# Patient Record
Sex: Female | Born: 1980 | Race: Black or African American | Hispanic: No | Marital: Married | Smoking: Current every day smoker
Health system: Southern US, Community
[De-identification: ages and names within clinical notes are randomized; demographics above are authoritative.]

## PROBLEM LIST (undated history)

## (undated) ENCOUNTER — Inpatient Hospital Stay (HOSPITAL_COMMUNITY): Payer: Self-pay

## (undated) DIAGNOSIS — L989 Disorder of the skin and subcutaneous tissue, unspecified: Secondary | ICD-10-CM

## (undated) DIAGNOSIS — D649 Anemia, unspecified: Secondary | ICD-10-CM

## (undated) HISTORY — PX: DILATION AND CURETTAGE OF UTERUS: SHX78

## (undated) HISTORY — DX: Anemia, unspecified: D64.9

---

## 2011-12-26 HISTORY — PX: COLPOSCOPY: SHX161

## 2012-03-03 ENCOUNTER — Encounter (HOSPITAL_COMMUNITY): Payer: Self-pay | Admitting: *Deleted

## 2012-03-03 ENCOUNTER — Emergency Department (HOSPITAL_COMMUNITY)
Admission: EM | Admit: 2012-03-03 | Discharge: 2012-03-03 | Disposition: A | Discharge status: Home / Self Care | Payer: Medicaid - Out of State | Attending: Emergency Medicine | Admitting: Emergency Medicine

## 2012-03-03 DIAGNOSIS — L0291 Cutaneous abscess, unspecified: Secondary | ICD-10-CM | POA: Diagnosis present

## 2012-03-03 DIAGNOSIS — L0390 Cellulitis, unspecified: Secondary | ICD-10-CM | POA: Diagnosis present

## 2012-03-03 DIAGNOSIS — F17200 Nicotine dependence, unspecified, uncomplicated: Secondary | ICD-10-CM | POA: Insufficient documentation

## 2012-03-03 DIAGNOSIS — L03319 Cellulitis of trunk, unspecified: Secondary | ICD-10-CM | POA: Insufficient documentation

## 2012-03-03 DIAGNOSIS — L02219 Cutaneous abscess of trunk, unspecified: Secondary | ICD-10-CM | POA: Diagnosis not present

## 2012-03-03 DIAGNOSIS — L02211 Cutaneous abscess of abdominal wall: Secondary | ICD-10-CM

## 2012-03-03 MED ORDER — LIDOCAINE HCL 1 % IJ SOLN
INTRAMUSCULAR | Status: AC
  Administered 2012-03-03: 09:00:00 via SUBCUTANEOUS
  Filled 2012-03-03: qty 20

## 2012-03-03 MED ORDER — HYDROCODONE-ACETAMINOPHEN 5-325 MG PO TABS: 1.0000 | ORAL_TABLET | Freq: Four times a day (QID) | ORAL | Status: AC | PRN

## 2012-03-03 MED ORDER — OXYCODONE-ACETAMINOPHEN 5-325 MG PO TABS: 1.0000 | ORAL_TABLET | Freq: Once | ORAL | Status: DC

## 2012-03-03 NOTE — ED Notes (Signed)
Patient is alert and oriented x3.  She was given DC instructions and follow up visit instructions.  Patient gave verbal understanding. She was DC ambulatory under his own power to home.  V/S stable.  He was not showing any signs of distress on DC 

## 2012-03-03 NOTE — ED Notes (Signed)
Pt c/o boil in RLQ of abdomen. Pt denies hx of same. Pt denies drainage.

## 2012-03-03 NOTE — Discharge Instructions (Signed)
Return here 2 days for recheck.  Keep area around the abscess clean and dry.  Use heat around the area as well.

## 2012-03-03 NOTE — ED Provider Notes (Signed)
History     CSN: [id number]  Arrival date & time 03/03/12  0551   First MD Initiated Contact with Patient 03/03/12 [phone]      Chief Complaint  Patient presents with  . Abscess    (Consider location/radiation/quality/duration/timing/severity/associated sxs/prior treatment) HPI Patient presents the emergency department with an abscess that developed she noticed on Tuesday.  The area that involves the right lower abdominal wall.  She denies fevers, nausea/vomiting, diarrhea,or dysuria.  Patient states that she attempted to squeeze the area to relieve some of the pressure but was unsuccessful. History reviewed. No pertinent past medical history.  Past Surgical History  Procedure Date  . Dilation and curettage of uterus     History reviewed. No pertinent family history.  History  Substance Use Topics  . Smoking status: Current Everyday Smoker -- 0.3 packs/day    Types: Cigarettes  . Smokeless tobacco: Not on file  . Alcohol Use: Yes     occasionally    OB History    Grav Para Term Preterm Abortions TAB SAB Ect Mult Living                  Review of Systems All pertinent positives and negatives reviewed in the history of present illness  Allergies  Codeine and Latex  Home Medications  No current outpatient prescriptions on file.  BP 135/69  Pulse 80  Temp(Src) 98.2 F (36.8 C) (Oral)  Resp 20  SpO2 100%  LMP 02/21/2012  Physical Exam  Constitutional: She appears well-developed and well-nourished. No distress.  HENT:  Head: Normocephalic and atraumatic.  Cardiovascular: Normal rate, regular rhythm and normal heart sounds.  Exam reveals no gallop and no friction rub.   No murmur heard. Pulmonary/Chest: Effort normal and breath sounds normal. No respiratory distress.  Abdominal:       Patient has an abscess noted to the right lower abdominal wall.  Patient has some drainage noted from the central portion of this abscess.  Skin: Skin is warm and dry.    ED  Course  Procedures (including critical care time)  INCISION AND DRAINAGE Performed by: Carlyle Dolly Consent: Verbal consent obtained. Risks and benefits: risks, benefits and alternatives were discussed Type: abscess  Body area:R lower abd wall  Anesthesia: local infiltration  Local anesthetic: lidocaine 1%  Anesthetic total: 10 ml  Complexity: complex Blunt dissection to break up loculations  Drainage: purulent  Drainage amount: Large  Packing material: 1/4 in iodoform gauze  Patient tolerance: Patient tolerated the procedure well with no immediate complications.   A she is advised to keep the area around the site clean and dry.  Is advised to keep it covered.  Told to use heat around the area.  Advised to come in 2 days for recheck and possible packing removal.  All questions were answered and the plan discussed.  She voices an understanding of the plan.        MDM          Carlyle Dolly, PA-C 03/03/12 [phone]

## 2012-03-03 NOTE — ED Provider Notes (Signed)
Medical screening examination/treatment/procedure(s) were performed by non-physician practitioner and as supervising physician I was immediately available for consultation/collaboration.   Glynn Octave, MD 03/03/12 1736

## 2012-03-05 ENCOUNTER — Encounter (HOSPITAL_COMMUNITY): Payer: Self-pay | Admitting: *Deleted

## 2012-03-05 ENCOUNTER — Emergency Department (HOSPITAL_COMMUNITY)
Admission: EM | Admit: 2012-03-05 | Discharge: 2012-03-05 | Disposition: A | Discharge status: Home / Self Care | Payer: Medicaid - Out of State | Attending: Emergency Medicine | Admitting: Emergency Medicine

## 2012-03-05 DIAGNOSIS — F17200 Nicotine dependence, unspecified, uncomplicated: Secondary | ICD-10-CM | POA: Insufficient documentation

## 2012-03-05 DIAGNOSIS — Z4801 Encounter for change or removal of surgical wound dressing: Secondary | ICD-10-CM | POA: Insufficient documentation

## 2012-03-05 DIAGNOSIS — L02211 Cutaneous abscess of abdominal wall: Secondary | ICD-10-CM

## 2012-03-05 NOTE — ED Provider Notes (Signed)
History     CSN: [id number]  Arrival date & time 03/05/12  1805   First MD Initiated Contact with Patient 03/05/12 1957      Chief Complaint  Patient presents with  . Wound Check    needs packing removed.  denies any fever or abnormalities.     (Consider location/radiation/quality/duration/timing/severity/associated sxs/prior treatment) HPI   31 year old female with history of low abdominal wall cutaneous abscess with recent I&D 2 days ago is presenting here for a wound recheck.  Patient states after the I&D procedure she has felt much better. She is able to move around without any significant pain. She denies fever. She denies worsening swelling. She denies rash.  History reviewed. No pertinent past medical history.  Past Surgical History  Procedure Date  . Dilation and curettage of uterus     History reviewed. No pertinent family history.  History  Substance Use Topics  . Smoking status: Current Everyday Smoker -- 0.3 packs/day    Types: Cigarettes  . Smokeless tobacco: Not on file  . Alcohol Use: Yes     occasionally    OB History    Grav Para Term Preterm Abortions TAB SAB Ect Mult Living                  Review of Systems  All other systems reviewed and are negative.    Allergies  Codeine and Latex  Home Medications   Current Outpatient Rx  Name Route Sig Dispense Refill  . HYDROCODONE-ACETAMINOPHEN 5-325 MG PO TABS Oral Take 1 tablet by mouth every 6 (six) hours as needed for pain. 15 tablet 0    BP 144/69  Pulse 74  Temp(Src) 98.7 F (37.1 C) (Oral)  Resp 16  Ht 5\' 6"  (1.676 m)  Wt 240 lb (108.863 kg)  BMI 38.74 kg/m2  SpO2 100%  LMP 02/21/2012  Physical Exam  Nursing note and vitals reviewed. Constitutional: She appears well-nourished.  HENT:  Head: Atraumatic.  Eyes: Conjunctivae are normal.  Abdominal: Soft.  Musculoskeletal: Normal range of motion.  Neurological: She is alert.  Skin: Skin is warm.       ED Course    Procedures (including critical care time)  Labs Reviewed - No data to display No results found.   No diagnosis found.    MDM  Improved abscess.  Packing removed without difficulty.  Discharge instruction given.          Fayrene Helper, PA-C 03/05/12 2112

## 2012-03-05 NOTE — Discharge Instructions (Signed)
Abscess  Care After  An abscess (also called a boil or furuncle) is an infected area that contains a collection of pus. Signs and symptoms of an abscess include pain, tenderness, redness, or hardness, or you may feel a moveable soft area under your skin. An abscess can occur anywhere in the body. The infection may spread to surrounding tissues causing cellulitis. A cut (incision) by the surgeon was made over your abscess and the pus was drained out. Gauze may have been packed into the space to provide a drain that will allow the cavity to heal from the inside outwards. The boil may be painful for 5 to 7 days. Most people with a boil do not have high fevers. Your abscess, if seen early, may not have localized, and may not have been lanced. If not, another appointment may be required for this if it does not get better on its own or with medications.  HOME CARE INSTRUCTIONS     Only take over-the-counter or prescription medicines for pain, discomfort, or fever as directed by your caregiver.    When you bathe, soak and then remove gauze or iodoform packs at least daily or as directed by your caregiver. You may then wash the wound gently with mild soapy water. Repack with gauze or do as your caregiver directs.   SEEK IMMEDIATE MEDICAL CARE IF:     You develop increased pain, swelling, redness, drainage, or bleeding in the wound site.    You develop signs of generalized infection including muscle aches, chills, fever, or a general ill feeling.    An oral temperature above 102 F (38.9 C) develops, not controlled by medication.   See your caregiver for a recheck if you develop any of the symptoms described above. If medications (antibiotics) were prescribed, take them as directed.  Document Released: 06/29/2005 Document Revised: 11/30/2011 Document Reviewed: 02/24/2008  [employer] Patient Information 2012 [employer].

## 2012-03-05 NOTE — ED Notes (Signed)
Pt in for wound recheck, states she had packing placed two days ago to right lower abd

## 2012-03-06 NOTE — ED Provider Notes (Signed)
Medical screening examination/treatment/procedure(s) were performed by non-physician practitioner and as supervising physician I was immediately available for consultation/collaboration.  Aribella Vavra, MD 03/06/12 0950 

## 2012-05-21 LAB — OB RESULTS CONSOLE GC/CHLAMYDIA: Gonorrhea: NEGATIVE

## 2012-05-22 LAB — HCG, QUANTITATIVE, PREGNANCY: hCG,Beta Subunit,Qual,Serum: 52780

## 2012-05-29 ENCOUNTER — Encounter (HOSPITAL_COMMUNITY): Payer: Self-pay | Admitting: Emergency Medicine

## 2012-05-29 ENCOUNTER — Emergency Department (HOSPITAL_COMMUNITY)
Admission: EM | Admit: 2012-05-29 | Discharge: 2012-05-29 | Disposition: A | Discharge status: Home / Self Care | Payer: Medicaid Other | Attending: Emergency Medicine | Admitting: Emergency Medicine

## 2012-05-29 DIAGNOSIS — A499 Bacterial infection, unspecified: Secondary | ICD-10-CM | POA: Insufficient documentation

## 2012-05-29 DIAGNOSIS — N760 Acute vaginitis: Secondary | ICD-10-CM | POA: Insufficient documentation

## 2012-05-29 DIAGNOSIS — Z3490 Encounter for supervision of normal pregnancy, unspecified, unspecified trimester: Secondary | ICD-10-CM

## 2012-05-29 DIAGNOSIS — R112 Nausea with vomiting, unspecified: Secondary | ICD-10-CM | POA: Insufficient documentation

## 2012-05-29 DIAGNOSIS — B9689 Other specified bacterial agents as the cause of diseases classified elsewhere: Secondary | ICD-10-CM | POA: Insufficient documentation

## 2012-05-29 DIAGNOSIS — O2390 Unspecified genitourinary tract infection in pregnancy, unspecified trimester: Secondary | ICD-10-CM | POA: Insufficient documentation

## 2012-05-29 LAB — CBC
Hemoglobin: 13.2 g/dL (ref 12.0–15.0)
MCHC: 33.8 g/dL (ref 30.0–36.0)
RBC: 4.61 MIL/uL (ref 3.87–5.11)

## 2012-05-29 LAB — HCG, QUANTITATIVE, PREGNANCY: hCG, Beta Chain, Quant, S: 59699 m[IU]/mL — ABNORMAL HIGH (ref <5)

## 2012-05-29 LAB — URINALYSIS, ROUTINE W REFLEX MICROSCOPIC
Glucose, UA: NEGATIVE mg/dL
Nitrite: NEGATIVE
Protein, ur: NEGATIVE mg/dL

## 2012-05-29 LAB — DIFFERENTIAL
Basophils Relative: 0 % (ref 0–1)
Eosinophils Absolute: 0.5 10*3/uL (ref 0.0–0.7)
Eosinophils Relative: 3 % (ref 0–5)
Lymphocytes Relative: 31 % (ref 12–46)
Neutrophils Relative %: 59 % (ref 43–77)

## 2012-05-29 LAB — POCT I-STAT, CHEM 8
BUN: 9 mg/dL (ref 6–23)
Calcium, Ion: 1.32 mmol/L (ref 1.12–1.32)
Chloride: 105 mEq/L (ref 96–112)
HCT: 42 % (ref 36.0–46.0)
Potassium: 3.8 mEq/L (ref 3.5–5.1)
Sodium: 140 mEq/L (ref 135–145)

## 2012-05-29 LAB — URINE MICROSCOPIC-ADD ON

## 2012-05-29 LAB — POCT PREGNANCY, URINE: Preg Test, Ur: POSITIVE — AB

## 2012-05-29 LAB — WET PREP, GENITAL

## 2012-05-29 MED ORDER — METRONIDAZOLE 500 MG PO TABS: 500.0000 mg | ORAL_TABLET | Freq: Two times a day (BID) | ORAL | Status: AC

## 2012-05-29 NOTE — ED Provider Notes (Signed)
History     CSN: [id number]  Arrival date & time 05/29/12  0546    6:30 AM HPI Patient reports last menstrual cycle was April 1. Reports she is approximately [redacted] weeks pregnant. States last night she woke up abruptly with lower abdominal cramping. Denies vaginal discharge, vaginal bleeding, dysuria, hematuria, diarrhea, constipation, fever. Reports associated nausea and vomiting with pregnancy. 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 Reports her last D&C was in September 2012. Patient is a 31 y.o. female presenting with cramps. The history is provided by the patient.  Abdominal Cramping The primary symptoms of the illness include abdominal pain, nausea, vomiting and vaginal discharge. The primary symptoms of the illness do not include fever, diarrhea or dysuria. The current episode started 1 to 2 hours ago. The onset of the illness was sudden. The problem has not changed since onset. The abdominal pain is located in the suprapubic region.  The vaginal discharge is not associated with dysuria.  Symptoms associated with the illness do not include chills, constipation, urgency or hematuria.    History reviewed. No pertinent past medical history.  Past Surgical History  Procedure Date  . Dilation and curettage of uterus     History reviewed. No pertinent family history.  History  Substance Use Topics  . Smoking status: Former Smoker -- 0.3 packs/day    Types: Cigarettes    Quit date: 05/20/2012  . Smokeless tobacco: Not on file  . Alcohol Use: No     occasionally    OB History    Grav Para Term Preterm Abortions TAB SAB Ect Mult Living   10    9  9          Review of Systems  Constitutional: Negative for fever and chills.  Gastrointestinal: Positive for nausea, vomiting and abdominal pain. Negative for diarrhea and constipation.  Genitourinary: Positive for vaginal discharge. Negative for dysuria, urgency, hematuria, flank pain, vaginal  pain and pelvic pain.  All other systems reviewed and are negative.    Allergies  Codeine and Latex  Home Medications   Current Outpatient Rx  Name Route Sig Dispense Refill  . PRENATAL MULTIVITAMIN CH Oral Take 1 tablet by mouth daily.      BP 138/74  Pulse 80  Temp(Src) 98.3 F (36.8 C) (Oral)  Resp 18  SpO2 100%  LMP 03/25/2012  Physical Exam  Vitals reviewed. Constitutional: She is oriented to person, place, and time. Vital signs are normal. She appears well-developed and well-nourished.  HENT:  Head: Normocephalic and atraumatic.  Eyes: Conjunctivae are normal. Pupils are equal, round, and reactive to light.  Neck: Normal range of motion. Neck supple.  Cardiovascular: Normal rate, regular rhythm and normal heart sounds.  Exam reveals no friction rub.   No murmur heard. Pulmonary/Chest: Effort normal and breath sounds normal. She has no wheezes. She has no rhonchi. She has no rales. She exhibits no tenderness.  Abdominal: Soft. Bowel sounds are normal. She exhibits no distension and no mass. There is no tenderness. There is no rebound and no guarding.  Genitourinary: Uterus normal. There is no tenderness or lesion on the right labia. There is no tenderness or lesion on the left labia. Uterus is not tender. Cervix exhibits no motion tenderness and no discharge. Right adnexum displays no mass, no tenderness and no fullness. Left adnexum displays no mass, no tenderness and no fullness. No tenderness or bleeding around the vagina. Vaginal discharge (white) found.  Musculoskeletal: Normal range of  motion.  Neurological: She is alert and oriented to person, place, and time. Coordination normal.  Skin: Skin is warm and dry. No rash noted. No erythema. No pallor.    ED Course  Procedures  Results for orders placed during the hospital encounter of 05/29/12  CBC      Component Value Range   WBC 16.1 (*) 4.0 - 10.5 (K/uL)   RBC 4.61  3.87 - 5.11 (MIL/uL)   Hemoglobin 13.2   12.0 - 15.0 (g/dL)   HCT 69.6  29.5 - 28.4 (%)   MCV 84.6  78.0 - 100.0 (fL)   MCH 28.6  26.0 - 34.0 (pg)   MCHC 33.8  30.0 - 36.0 (g/dL)   RDW 13.2 (*) 44.0 - 15.5 (%)   Platelets 262  150 - 400 (K/uL)  DIFFERENTIAL      Component Value Range   Neutrophils Relative 59  43 - 77 (%)   Lymphocytes Relative 31  12 - 46 (%)   Monocytes Relative 7  3 - 12 (%)   Eosinophils Relative 3  0 - 5 (%)   Basophils Relative 0  0 - 1 (%)   Neutro Abs 9.5 (*) 1.7 - 7.7 (K/uL)   Lymphs Abs 5.0 (*) 0.7 - 4.0 (K/uL)   Monocytes Absolute 1.1 (*) 0.1 - 1.0 (K/uL)   Eosinophils Absolute 0.5  0.0 - 0.7 (K/uL)   Basophils Absolute 0.0  0.0 - 0.1 (K/uL)   WBC Morphology MILD LEFT SHIFT (1-5% METAS, OCC MYELO, OCC BANDS)    URINALYSIS, ROUTINE W REFLEX MICROSCOPIC      Component Value Range   Color, Urine YELLOW  YELLOW    APPearance CLOUDY (*) CLEAR    Specific Gravity, Urine 1.029  1.005 - 1.030    pH 5.5  5.0 - 8.0    Glucose, UA NEGATIVE  NEGATIVE (mg/dL)   Hgb urine dipstick NEGATIVE  NEGATIVE    Bilirubin Urine NEGATIVE  NEGATIVE    Ketones, ur NEGATIVE  NEGATIVE (mg/dL)   Protein, ur NEGATIVE  NEGATIVE (mg/dL)   Urobilinogen, UA 0.2  0.0 - 1.0 (mg/dL)   Nitrite NEGATIVE  NEGATIVE    Leukocytes, UA SMALL (*) NEGATIVE   WET PREP, GENITAL      Component Value Range   Yeast Wet Prep HPF POC NONE SEEN  NONE SEEN    Trich, Wet Prep NONE SEEN  NONE SEEN    Clue Cells Wet Prep HPF POC TOO NUMEROUS TO COUNT (*) NONE SEEN    WBC, Wet Prep HPF POC MODERATE (*) NONE SEEN   POCT PREGNANCY, URINE      Component Value Range   Preg Test, Ur POSITIVE (*) NEGATIVE   POCT I-STAT, CHEM 8      Component Value Range   Sodium 140  135 - 145 (mEq/L)   Potassium 3.8  3.5 - 5.1 (mEq/L)   Chloride 105  96 - 112 (mEq/L)   BUN 9  6 - 23 (mg/dL)   Creatinine, Ser 1.02  0.50 - 1.10 (mg/dL)   Glucose, Bld 725 (*) 70 - 99 (mg/dL)   Calcium, Ion 3.66  4.40 - 1.32 (mmol/L)   TCO2 22  0 - 100 (mmol/L)   Hemoglobin  14.3  12.0 - 15.0 (g/dL)   HCT 34.7  42.5 - 95.6 (%)  URINE MICROSCOPIC-ADD ON      Component Value Range   Squamous Epithelial / LPF MANY (*) RARE    WBC, UA 3-6  <3 (  WBC/hpf)   RBC / HPF 0-2  <3 (RBC/hpf)   Bacteria, UA MANY (*) RARE     MDM   Patient has bacterial vaginosis and intrauterine pregnancy on bedside ultrasound. Will discharge with prescription for Flagyl and referral for OB/GYN. Also will [facility]. Patient voices understanding and is ready for discharge      Thomasene Lot, PA-C 05/29/12 8413

## 2012-05-29 NOTE — ED Notes (Signed)
Patient reports lower abdominal cramping and nausea. Patient reports that she is [redacted] weeks pregnant.

## 2012-05-29 NOTE — ED Notes (Signed)
MD at bedside. 

## 2012-05-29 NOTE — ED Notes (Signed)
Pt given discharge instructions and explained by EDP, pt in no distress upon discharge. Escorted to discharge window.

## 2012-05-29 NOTE — ED Notes (Signed)
31 y/o female with recurrent pregnancy - she had some mild abd cramping this AM which has since resolved, no spotting, no n/v/f/c/cough/sob/swelling.  PE:  abd soft and obese - non tender, lungs clear, heart regular, no acute distress  Assessment:  Bedside ultrasound by myself shows intrauterine pregnancy, early, cardiac activity seen, patient reassured, pelvic pending her physician assistant exam. Anticipate discharge with OB/GYN followup. No tenderness at this time, doubt appendicitis or other significant pathologic source of abdominal cramping.  Medical screening examination/treatment/procedure(s) were conducted as a shared visit with non-physician practitioner(s) and myself.  I personally evaluated the patient during the encounter    Vida Roller, MD 05/29/12 [phone]

## 2012-05-29 NOTE — ED Notes (Signed)
Pt sitting up in bed at this time in no distress, denies abdominal cramping or nausea, states she "feels fine now".

## 2012-05-29 NOTE — Discharge Instructions (Signed)
Bacterial Vaginosis Bacterial vaginosis (BV) is a vaginal infection where the normal balance of bacteria in the vagina is disrupted. The normal balance is then replaced by an overgrowth of certain bacteria. There are several different kinds of bacteria that can cause BV. BV is the most common vaginal infection in women of childbearing age. CAUSES   The cause of BV is not fully understood. BV develops when there is an increase or imbalance of harmful bacteria.   Some activities or behaviors can upset the normal balance of bacteria in the vagina and put women at increased risk including:   Having a new sex partner or multiple sex partners.   Douching.   Using an intrauterine device (IUD) for contraception.   It is not clear what role sexual activity plays in the development of BV. However, women that have never had sexual intercourse are rarely infected with BV.  Women do not get BV from toilet seats, bedding, swimming pools or from touching objects around them.  SYMPTOMS   Grey vaginal discharge.   A fish-like odor with discharge, especially after sexual intercourse.   Itching or burning of the vagina and vulva.   Burning or pain with urination.   Some women have no signs or symptoms at all.  DIAGNOSIS  Your caregiver must examine the vagina for signs of BV. Your caregiver will perform lab tests and look at the sample of vaginal fluid through a microscope. They will look for bacteria and abnormal cells (clue cells), a pH test higher than 4.5, and a positive amine test all associated with BV.  RISKS AND COMPLICATIONS   Pelvic inflammatory disease (PID).   Infections following gynecology surgery.   Developing HIV.   Developing herpes virus.  TREATMENT  Sometimes BV will clear up without treatment. However, all women with symptoms of BV should be treated to avoid complications, especially if gynecology surgery is planned. Female partners generally do not need to be treated. However,  BV may spread between female sex partners so treatment is helpful in preventing a recurrence of BV.   BV may be treated with antibiotics. The antibiotics come in either pill or vaginal cream forms. Either can be used with nonpregnant or pregnant women, but the recommended dosages differ. These antibiotics are not harmful to the baby.   BV can recur after treatment. If this happens, a second round of antibiotics will often be prescribed.   Treatment is important for pregnant women. If not treated, BV can cause a premature delivery, especially for a pregnant woman who had a premature birth in the past. All pregnant women who have symptoms of BV should be checked and treated.   For chronic reoccurrence of BV, treatment with a type of prescribed gel vaginally twice a week is helpful.  HOME CARE INSTRUCTIONS   Finish all medication as directed by your caregiver.   Do not have sex until treatment is completed.   Tell your sexual partner that you have a vaginal infection. They should see their caregiver and be treated if they have problems, such as a mild rash or itching.   Practice safe sex. Use condoms. Only have 1 sex partner.  PREVENTION  Basic prevention steps can help reduce the risk of upsetting the natural balance of bacteria in the vagina and developing BV:  Do not have sexual intercourse (be abstinent).   Do not douche.   Use all of the medicine prescribed for treatment of BV, even if the signs and symptoms go away.     Tell your sex partner if you have BV. That way, they can be treated, if needed, to prevent reoccurrence.  SEEK MEDICAL CARE IF:   Your symptoms are not improving after 3 days of treatment.   You have increased discharge, pain, or fever.  MAKE SURE YOU:   Understand these instructions.   Will watch your condition.   Will get help right away if you are not doing well or get worse.  FOR MORE INFORMATION  Division of STD Prevention (DSTDP), Centers for Disease  Control and Prevention: www.cdc.gov/std [employer] Association (ASHA): www.ashastd.org  Document Released: 12/11/2005 Document Revised: 11/30/2011 Document Reviewed: 06/03/2009 [employer] Patient Information 2012 [employer]. 

## 2012-05-29 NOTE — ED Provider Notes (Signed)
Medical screening examination/treatment/procedure(s) were conducted as a shared visit with non-physician practitioner(s) and myself.  I personally evaluated the patient during the encounter  Please see my separate respective documentation pertaining to this patient encounter   Vida Roller, MD 05/29/12 1818

## 2012-05-29 NOTE — ED Notes (Signed)
Pt states she is [redacted] weeks pregnant  This is her 10th pregnancy  Pt has no living children  Pt states they had to take one in Sept as it had no heartbeat.  Pt states she woke this morning with lower abd cramping and nausea  Pt denies any spotting or bleeding  Pt states the cramping has stopped since she got to the hospital

## 2012-06-07 NOTE — ED Notes (Signed)
Called in an antibiotic, Macrobid, for bacteruria.  also spoke with patient personally, who is aware and will go pick up antibiotic for bacteruria  Thomasene Lot, PA-C 06/07/12 1501

## 2012-06-25 ENCOUNTER — Inpatient Hospital Stay (HOSPITAL_COMMUNITY)
Admission: AD | Admit: 2012-06-25 | Discharge: 2012-06-25 | Disposition: A | Discharge status: Home / Self Care | Payer: Medicaid Other | Source: Ambulatory Visit | Attending: Obstetrics & Gynecology | Admitting: Obstetrics & Gynecology

## 2012-06-25 ENCOUNTER — Encounter (HOSPITAL_COMMUNITY): Payer: Self-pay | Admitting: *Deleted

## 2012-06-25 ENCOUNTER — Inpatient Hospital Stay (HOSPITAL_COMMUNITY): Payer: Medicaid Other

## 2012-06-25 DIAGNOSIS — O209 Hemorrhage in early pregnancy, unspecified: Secondary | ICD-10-CM | POA: Insufficient documentation

## 2012-06-25 DIAGNOSIS — Z331 Pregnant state, incidental: Secondary | ICD-10-CM

## 2012-06-25 DIAGNOSIS — Z3490 Encounter for supervision of normal pregnancy, unspecified, unspecified trimester: Secondary | ICD-10-CM

## 2012-06-25 LAB — CBC
MCH: 29 pg (ref 26.0–34.0)
MCHC: 28.1 g/dL — ABNORMAL LOW (ref 30.0–36.0)
Platelets: 236 10*3/uL (ref 150–400)
RDW: 16 % — ABNORMAL HIGH (ref 11.5–15.5)

## 2012-06-25 LAB — ABO/RH: ABO/RH(D): O POS

## 2012-06-25 LAB — CHG GLUCOSE TEST
Glucose 1 Hr Prenatal, POC: 109 mg/dL
Glucose 1 Hr Prenatal, POC: 109 mg/dL

## 2012-06-25 NOTE — MAU Provider Note (Signed)
History     CSN: [id number]  Arrival date and time: 06/25/12 0557   None     Chief Complaint  Patient presents with  . Vaginal Bleeding   HPI This is a 31 y.o. female at [redacted] weeks gestation by LMP who presents with c/o vaginal bleeding. It got heavier this am and she came in. HIstory is remarkable for 9 SABs.  Never got past first trimester. Had most of them in [place] "and never got worked up". Had 2 in [place]. They referred her for Genetic testing "and all my markers were normal".  Was referred to an OB there for "other blood work" but "he got closed down".Alison Mcdonald to [facility] health dept yesterday and was referred "here for high risk". Denies pain.   OB History    Grav Para Term Preterm Abortions TAB SAB Ect Mult Living   10    9  9          History reviewed. No pertinent past medical history.  Past Surgical History  Procedure Date  . Dilation and curettage of uterus     Family History  Problem Relation Age of Onset  . Heart disease Mother     History  Substance Use Topics  . Smoking status: Former Smoker -- 0.3 packs/day    Types: Cigarettes    Quit date: 05/20/2012  . Smokeless tobacco: Not on file  . Alcohol Use: No     occasionally    Allergies:  Allergies  Allergen Reactions  . Codeine Hives  . Latex Hives    Prescriptions prior to admission  Medication Sig Dispense Refill  . Prenatal Vit-Fe Fumarate-FA (PRENATAL MULTIVITAMIN) TABS Take 1 tablet by mouth daily.        ROS As listed in HPI  Physical Exam   Blood pressure 130/76, pulse 87, temperature 98.5 F (36.9 C), temperature source Oral, resp. rate 18, height 5\' 4"  (1.626 m), weight 258 lb (117.028 kg), last menstrual period 03/25/2012, SpO2 100.00%.  Physical Exam  Constitutional: She is oriented to person, place, and time. She appears well-developed and well-nourished. No distress.  Cardiovascular: Normal rate.   Respiratory: Effort normal.  GI: Soft. She exhibits no distension and no mass.  There is no tenderness. There is no rebound and no guarding.  Genitourinary: Uterus normal. Vaginal discharge (small to moderate red blood in vault. Cervix long and closed) found.  Musculoskeletal: Normal range of motion.  Neurological: She is alert and oriented to person, place, and time.  Skin: Skin is warm and dry.  Psychiatric: She has a normal mood and affect.    MAU Course  Procedures  MDM WIll check [place] >> US Ob Comp Less 14 Wks  06/25/2012  *RADIOLOGY REPORT*  Clinical Data: 31 year old G10 SAB9, LMP 03/25/2012 (13 weeks 1 day), presenting with vaginal bleeding.  OBSTETRIC <14 WK ULTRASOUND  Technique:  Transabdominal ultrasound was performed for evaluation of the gestation as well as the maternal uterus and adnexal regions.  Comparison:  None this gestation.  Intrauterine gestational sac: Single, normal in shape. Yolk sac: Not visualized. Embryo: Visualized. Cardiac Activity: Visualized. Heart Rate: 161 bpm  CRL:  57.1 mm  12w  2d        [place] EDC: 01/05/2013.  Maternal uterus/Adnexae: No visible subchorionic hemorrhage.  Both ovaries normal in size and appearance, the left measuring approximately 2.7 x 2.0 x 2.3 cm and the right measuring approximately 2.5 x 1.1 x 1.4 cm.  No adnexal masses or free pelvic fluid.  IMPRESSION:  1.  Single live intrauterine fetus with estimated gestational age of 12 days 2 weeks by crown-rump length, only slightly less than the estimated gestational age by LMP of 13 weeks 1 day.  Ultrasound [facility] 01/05/2013. 2.  No evidence of subchorionic hemorrhage. 3.  Normal-appearing ovaries.  No adnexal masses or free pelvic fluid.  Original Report Authenticated By: Arnell Sieving, M.D.    Assessment and Plan  A:  SIUP at 12.2 weeks      No evidence of [facility]      First trimester bleeding      Poor obstetric history  P:  Discharge home      States HD is referring her to our HR clinic      Pelvic rest  [facility] 06/25/2012, 6:45 AM

## 2012-06-25 NOTE — MAU Note (Signed)
Pt presents with complaint of "vaginal bleeding" this am. Pt tearful, states she has been pregnant 10 times and previous 9 resulted in miscarriage  And this is the farthest she has gotten. States she is not having any pain. States she has been constipated and when she went to the restroom this am and had a BM and afterward there was blood on the tissue. When she went to the restroom here there was also blood on the tissue .

## 2012-06-25 NOTE — Discharge Instructions (Signed)
Pelvic Rest Pelvic rest is sometimes recommended for women when:   The placenta is partially or completely covering the opening of the cervix (placenta previa).   There is bleeding between the uterine wall and the amniotic sac in the first trimester (subchorionic hemorrhage).   The cervix begins to open without labor starting (incompetent cervix, cervical insufficiency).   The labor is too early (preterm labor).  HOME CARE INSTRUCTIONS  Do not have sexual intercourse, stimulation, or an orgasm.   Do not use tampons, douche, or put anything in the vagina.   Do not lift anything over 10 pounds (4.5 kg).   Avoid strenuous activity or straining your pelvic muscles.  SEEK MEDICAL CARE IF:  You have any vaginal bleeding during pregnancy. Treat this as a potential emergency.   You have cramping pain felt low in the stomach (stronger than menstrual cramps).   You notice vaginal discharge (watery, mucus, or bloody).   You have a low, dull backache.   There are regular contractions or uterine tightening.  SEEK IMMEDIATE MEDICAL CARE IF: You have vaginal bleeding and have placenta previa.  Document Released: 04/07/2011 Document Revised: 11/30/2011 Document Reviewed: 04/07/2011 ExitCare Patient Information 2012 [employer].Vaginal Bleeding During Pregnancy A small amount of bleeding from the vagina can happen anytime during pregnancy. Be sure to tell your doctor about all vaginal bleeding.  HOME CARE  Get plenty of rest and sleep.   Count the number of pads you use each day. Do not use tampons.   Save any tissue you pass for your doctor to see.   Do not exercise   Do not do any heavy lifting.   Avoid going up and down stairs. If you must climb stairs, go slowly.   Do not have sex (intercourse) or orgasms until approved by your doctor.   Do not douche.   Only take medicine as told by your doctor. Do not take aspirin.   Eat healthy.   Always keep your follow-up  appointments.  GET HELP RIGHT AWAY IF:   You feel the baby moving less or not moving at all.   The bleeding gets worse.   You have very painful cramps or pain in your stomach or back.   You pass large clots or anything that looks like tissue.   You have a temperature by mouth above 102 F (38.9 C).   You feel very weak.   You have chills.   You feel dizzy or pass out (faint).   You have a gush of fluid from the vagina.  MAKE SURE YOU:   Understand these instructions.   Will watch your condition.   Will get help right away if you are not doing well or get worse.  Document Released: 09/19/2008 Document Revised: 11/30/2011 Document Reviewed: 11/16/2009 ExitCare Patient Information 2012 [employer]. 

## 2012-06-28 ENCOUNTER — Encounter (HOSPITAL_COMMUNITY): Payer: Self-pay | Admitting: *Deleted

## 2012-06-28 ENCOUNTER — Inpatient Hospital Stay (HOSPITAL_COMMUNITY)
Admission: AD | Admit: 2012-06-28 | Discharge: 2012-06-28 | Disposition: A | Discharge status: Home / Self Care | Payer: Medicaid - Out of State | Source: Ambulatory Visit | Attending: Obstetrics & Gynecology | Admitting: Obstetrics & Gynecology

## 2012-06-28 DIAGNOSIS — O200 Threatened abortion: Secondary | ICD-10-CM | POA: Insufficient documentation

## 2012-06-28 NOTE — MAU Note (Signed)
Patient states she was here on Monday/Tuesday for vaginal bleeding. Told everything was ok. Not sure why she was bleeding. Woke up around 530 am today and had bright red bleeding running down her leg. Patient states when she was walking from the parking lot into MAU felt like something "was trying to push out"

## 2012-06-28 NOTE — MAU Provider Note (Signed)
  History     CSN: [id number]  Arrival date and time: 06/28/12 4098   First Provider Initiated Contact with Patient 06/28/12 [phone]      Chief Complaint  Patient presents with  . Vaginal Bleeding   HPI This is a 31 yo G10P0090 at 12.5 weeks who presents with vaginal bleeding.  She had an episode of bleeding three days ago that stopped that same day.  She was evaluated here - ultrasound showed IUP with appropriate dating and no evidence of subchorionic bleeding.  Patient was sent home with pelvic rest and precautions for threatened AB.  Had additional bleeding this morning - got up from bathroom and felt "gush of blood" - larger amount than 3 days ago.  Denies intercourse, cramping, vaginal discharge, abdominal pain.  OB History    Grav Para Term Preterm Abortions TAB SAB Ect Mult Living   10    9  9          History reviewed. No pertinent past medical history.  Past Surgical History  Procedure Date  . Dilation and curettage of uterus     Family History  Problem Relation Age of Onset  . Heart disease Mother     History  Substance Use Topics  . Smoking status: Former Smoker -- 0.3 packs/day    Types: Cigarettes    Quit date: 05/20/2012  . Smokeless tobacco: Not on file  . Alcohol Use: No     occasionally    Allergies:  Allergies  Allergen Reactions  . Codeine Hives  . Latex Hives    Prescriptions prior to admission  Medication Sig Dispense Refill  . Prenatal Vit-Fe Fumarate-FA (PRENATAL MULTIVITAMIN) TABS Take 1 tablet by mouth daily.        ROS Physical Exam   Blood pressure 136/68, pulse 89, temperature 99 F (37.2 C), temperature source Oral, resp. rate 18, height 5\' 4"  (1.626 m), weight 117.028 kg (258 lb), last menstrual period 03/25/2012.  Physical Exam  Constitutional: She is oriented to person, place, and time. She appears well-developed and well-nourished.  HENT:  Head: Normocephalic and atraumatic.  GI: Soft. She exhibits no distension and no mass.  There is tenderness (mild pelvic tenderness). There is no rebound and no guarding.  Neurological: She is alert and oriented to person, place, and time.  Skin: Skin is warm and dry.  Psychiatric: She has a normal mood and affect. Her behavior is normal. Judgment and thought content normal.   FHR: 160 MAU Course  Procedures  MDM  Assessment and Plan  1.  Threatened AB  Continued pelvic rest.  Patient to return with cramping and heavy bleeding.  F/U with [facility] on 7/18.  STINSON, JACOB JEHIEL 06/28/2012, 6:38 AM

## 2012-07-02 DIAGNOSIS — O09899 Supervision of other high risk pregnancies, unspecified trimester: Secondary | ICD-10-CM

## 2012-07-03 ENCOUNTER — Encounter: Payer: Self-pay | Admitting: Obstetrics and Gynecology

## 2012-07-11 ENCOUNTER — Encounter: Payer: Self-pay | Admitting: Family Medicine

## 2012-07-11 ENCOUNTER — Ambulatory Visit (INDEPENDENT_AMBULATORY_CARE_PROVIDER_SITE_OTHER): Payer: Medicaid Other | Admitting: Family Medicine

## 2012-07-11 VITALS — BP 134/75 | Temp 97.8°F | Wt 259.3 lb

## 2012-07-11 DIAGNOSIS — N939 Abnormal uterine and vaginal bleeding, unspecified: Secondary | ICD-10-CM

## 2012-07-11 DIAGNOSIS — O09299 Supervision of pregnancy with other poor reproductive or obstetric history, unspecified trimester: Secondary | ICD-10-CM | POA: Insufficient documentation

## 2012-07-11 DIAGNOSIS — O09219 Supervision of pregnancy with history of pre-term labor, unspecified trimester: Secondary | ICD-10-CM

## 2012-07-11 DIAGNOSIS — Z3480 Encounter for supervision of other normal pregnancy, unspecified trimester: Secondary | ICD-10-CM | POA: Insufficient documentation

## 2012-07-11 DIAGNOSIS — N898 Other specified noninflammatory disorders of vagina: Secondary | ICD-10-CM

## 2012-07-11 DIAGNOSIS — O0990 Supervision of high risk pregnancy, unspecified, unspecified trimester: Secondary | ICD-10-CM

## 2012-07-11 LAB — POCT URINALYSIS DIP (DEVICE)
Ketones, ur: NEGATIVE mg/dL
Protein, ur: 30 mg/dL — AB
Specific Gravity, Urine: 1.02 (ref 1.005–1.030)
Urobilinogen, UA: 0.2 mg/dL (ref 0.0–1.0)

## 2012-07-11 NOTE — Progress Notes (Signed)
Pulse 101  Edema trace in feet. Vaginal d/c stated as clear, thin; no odor, no itch.

## 2012-07-11 NOTE — Progress Notes (Signed)
   Subjective:    Alison Mcdonald is a [id number] [redacted]w[redacted]d being seen today for her first obstetrical visit.  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 Patient does intend to breast feed. Pregnancy history fully reviewed.  Patient reports vaginal bleeding.  Filed Vitals:   07/11/12 0945  BP: 134/75  Temp: 97.8 F (36.6 C)  Weight: 259 lb 4.8 oz (117.618 kg)    HISTORY: OB History    Grav Para Term Preterm Abortions TAB SAB Ect Mult Living   10    9 1 8         # Outc Date GA Lbr Len/2nd Wgt Sex Del Anes PTL Lv   1 SAB            2 SAB            3 SAB            4 SAB            5 SAB            6 SAB            7 SAB            8 SAB            9 TAB         No   Comments: Dand C   10 CUR              Past Medical History  Diagnosis Date  . Anemia   . 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 Dilation and curettage of uterus   . Colposcopy 12/2011   Family History  Problem Relation Age of Onset  . Heart disease Mother     had heart attack with stents placed  . Hypertension Mother   . Deep vein thrombosis Mother   . Hypertension Father   . Diabetes Father   . Asthma Brother      Exam    Uterus:     System:     Skin: normal coloration and turgor, no rashes    Neurologic: oriented, normal   Extremities: normal strength, tone, and muscle mass   HEENT PERRLA and extra ocular movement intact   Mouth/Teeth mucous membranes moist, pharynx normal without lesions   Neck supple and no masses   Cardiovascular: regular rate and rhythm   Respiratory:  appears well, vitals normal, no respiratory distress, acyanotic, normal RR, ear and throat exam is normal, neck free of mass or lymphadenopathy, chest clear, no wheezing, crepitations, rhonchi, normal symmetric air entry   Abdomen: soft, non-tender; bowel sounds normal; no masses,  no organomegaly          Assessment:    Pregnancy: [id number] Patient  Active Problem List  Diagnosis  . H/O miscarriage, currently pregnant  . Vaginal bleeding  . Supervision of high-risk pregnancy        Plan:     Initial labs drawn done at [facility].  Early 1hr gtc 109.  PAP pending.   Prenatal vitamins. Problem list reviewed and updated. Genetic Screening discussed Quad Screen: requested.  Ultrasound discussed; fetal survey: requested.  Follow up in 2 weeks. 50% of 45 min visit spent on counseling and coordination of care.  Hypercoag workup ordered for frequent miscarriages.   Candelaria Celeste JEHIEL 07/11/2012

## 2012-07-11 NOTE — Patient Instructions (Signed)
Pregnancy - Second Trimester The second trimester of pregnancy (3 to 6 months) is a period of rapid growth for you and your baby. At the end of the sixth month, your baby is about 9 inches long and weighs 1 1/2 pounds. You will begin to feel the baby move between 18 and 20 weeks of the pregnancy. This is called quickening. Weight gain is faster. A clear fluid (colostrum) may leak out of your breasts. You may feel small contractions of the womb (uterus). This is known as false labor or Braxton-Hicks contractions. This is like a practice for labor when the baby is ready to be born. Usually, the problems with morning sickness have usually passed by the end of your first trimester. Some women develop small dark blotches (called cholasma, mask of pregnancy) on their face that usually goes away after the baby is born. Exposure to the sun makes the blotches worse. Acne may also develop in some pregnant women and pregnant women who have acne, may find that it goes away. PRENATAL EXAMS  Blood work may continue to be done during prenatal exams. These tests are done to check on your health and the probable health of your baby. Blood work is used to follow your blood levels (hemoglobin). Anemia (low hemoglobin) is common during pregnancy. Iron and vitamins are given to help prevent this. You will also be checked for diabetes between 24 and 28 weeks of the pregnancy. Some of the previous blood tests may be repeated.   The size of the uterus is measured during each visit. This is to make sure that the baby is continuing to grow properly according to the dates of the pregnancy.   Your blood pressure is checked every prenatal visit. This is to make sure you are not getting toxemia.   Your urine is checked to make sure you do not have an infection, diabetes or protein in the urine.   Your weight is checked often to make sure gains are happening at the suggested rate. This is to ensure that both you and your baby are  growing normally.   Sometimes, an ultrasound is performed to confirm the proper growth and development of the baby. This is a test which bounces harmless sound waves off the baby so your caregiver can more accurately determine due dates.  Sometimes, a specialized test is done on the amniotic fluid surrounding the baby. This test is called an amniocentesis. The amniotic fluid is obtained by sticking a needle into the belly (abdomen). This is done to check the chromosomes in instances where there is a concern about possible genetic problems with the baby. It is also sometimes done near the end of pregnancy if an early delivery is required. In this case, it is done to help make sure the baby's lungs are mature enough for the baby to live outside of the womb. CHANGES OCCURING IN THE SECOND TRIMESTER OF PREGNANCY Your body goes through many changes during pregnancy. They vary from person to person. Talk to your caregiver about changes you notice that you are concerned about.  During the second trimester, you will likely have an increase in your appetite. It is normal to have cravings for certain foods. This varies from person to person and pregnancy to pregnancy.   Your lower abdomen will begin to bulge.   You may have to urinate more often because the uterus and baby are pressing on your bladder. It is also common to get more bladder infections during pregnancy (  pain with urination). You can help this by drinking lots of fluids and emptying your bladder before and after intercourse.   You may begin to get stretch marks on your hips, abdomen, and breasts. These are normal changes in the body during pregnancy. There are no exercises or medications to take that prevent this change.   You may begin to develop swollen and bulging veins (varicose veins) in your legs. Wearing support hose, elevating your feet for 15 minutes, 3 to 4 times a day and limiting salt in your diet helps lessen the problem.    Heartburn may develop as the uterus grows and pushes up against the stomach. Antacids recommended by your caregiver helps with this problem. Also, eating smaller meals 4 to 5 times a day helps.   Constipation can be treated with a stool softener or adding bulk to your diet. Drinking lots of fluids, vegetables, fruits, and whole grains are helpful.   Exercising is also helpful. If you have been very active up until your pregnancy, most of these activities can be continued during your pregnancy. If you have been less active, it is helpful to start an exercise program such as walking.   Hemorrhoids (varicose veins in the rectum) may develop at the end of the second trimester. Warm sitz baths and hemorrhoid cream recommended by your caregiver helps hemorrhoid problems.   Backaches may develop during this time of your pregnancy. Avoid heavy lifting, wear low heal shoes and practice good posture to help with backache problems.   Some pregnant women develop tingling and numbness of their hand and fingers because of swelling and tightening of ligaments in the wrist (carpel tunnel syndrome). This goes away after the baby is born.   As your breasts enlarge, you may have to get a bigger bra. Get a comfortable, cotton, support bra. Do not get a nursing bra until the last month of the pregnancy if you will be nursing the baby.   You may get a dark line from your belly button to the pubic area called the linea nigra.   You may develop rosy cheeks because of increase blood flow to the face.   You may develop spider looking lines of the face, neck, arms and chest. These go away after the baby is born.  HOME CARE INSTRUCTIONS   It is extremely important to avoid all smoking, herbs, alcohol, and unprescribed drugs during your pregnancy. These chemicals affect the formation and growth of the baby. Avoid these chemicals throughout the pregnancy to ensure the delivery of a healthy infant.   Most of your home  care instructions are the same as suggested for the first trimester of your pregnancy. Keep your caregiver's appointments. Follow your caregiver's instructions regarding medication use, exercise and diet.   During pregnancy, you are providing food for you and your baby. Continue to eat regular, well-balanced meals. Choose foods such as meat, fish, milk and other low fat dairy products, vegetables, fruits, and whole-grain breads and cereals. Your caregiver will tell you of the ideal weight gain.   A physical sexual relationship may be continued up until near the end of pregnancy if there are no other problems. Problems could include early (premature) leaking of amniotic fluid from the membranes, vaginal bleeding, abdominal pain, or other medical or pregnancy problems.   Exercise regularly if there are no restrictions. Check with your caregiver if you are unsure of the safety of some of your exercises. The greatest weight gain will occur in the   last 2 trimesters of pregnancy. Exercise will help you:   Control your weight.   Get you in shape for labor and delivery.   Lose weight after you have the baby.   Wear a good support or jogging bra for breast tenderness during pregnancy. This may help if worn during sleep. Pads or tissues may be used in the bra if you are leaking colostrum.   Do not use hot tubs, steam rooms or saunas throughout the pregnancy.   Wear your seat belt at all times when driving. This protects you and your baby if you are in an accident.   Avoid raw meat, uncooked cheese, cat litter boxes and soil used by cats. These carry germs that can cause birth defects in the baby.   The second trimester is also a good time to visit your dentist for your dental health if this has not been done yet. Getting your teeth cleaned is OK. Use a soft toothbrush. Brush gently during pregnancy.   It is easier to loose urine during pregnancy. Tightening up and strengthening the pelvic muscles will  help with this problem. Practice stopping your urination while you are going to the bathroom. These are the same muscles you need to strengthen. It is also the muscles you would use as if you were trying to stop from passing gas. You can practice tightening these muscles up 10 times a set and repeating this about 3 times per day. Once you know what muscles to tighten up, do not perform these exercises during urination. It is more likely to contribute to an infection by backing up the urine.   Ask for help if you have financial, counseling or nutritional needs during pregnancy. Your caregiver will be able to offer counseling for these needs as well as refer you for other special needs.   Your skin may become oily. If so, wash your face with mild soap, use non-greasy moisturizer and oil or cream based makeup.  MEDICATIONS AND DRUG USE IN PREGNANCY  Take prenatal vitamins as directed. The vitamin should contain 1 milligram of folic acid. Keep all vitamins out of reach of children. Only a couple vitamins or tablets containing iron may be fatal to a baby or young child when ingested.   Avoid use of all medications, including herbs, over-the-counter medications, not prescribed or suggested by your caregiver. Only take over-the-counter or prescription medicines for pain, discomfort, or fever as directed by your caregiver. Do not use aspirin.   Let your caregiver also know about herbs you may be using.   Alcohol is related to a number of birth defects. This includes fetal alcohol syndrome. All alcohol, in any form, should be avoided completely. Smoking will cause low birth rate and premature babies.   Street or illegal drugs are very harmful to the baby. They are absolutely forbidden. A baby born to an addicted mother will be addicted at birth. The baby will go through the same withdrawal an adult does.  SEEK MEDICAL CARE IF:  You have any concerns or worries during your pregnancy. It is better to call with  your questions if you feel they cannot wait, rather than worry about them. SEEK IMMEDIATE MEDICAL CARE IF:   An unexplained oral temperature above 102 F (38.9 C) develops, or as your caregiver suggests.   You have leaking of fluid from the vagina (birth canal). If leaking membranes are suspected, take your temperature and tell your caregiver of this when you call.   There   is vaginal spotting, bleeding, or passing clots. Tell your caregiver of the amount and how many pads are used. Light spotting in pregnancy is common, especially following intercourse.   You develop a bad smelling vaginal discharge with a change in the color from clear to white.   You continue to feel sick to your stomach (nauseated) and have no relief from remedies suggested. You vomit blood or coffee ground-like materials.   You lose more than 2 pounds of weight or gain more than 2 pounds of weight over 1 week, or as suggested by your caregiver.   You notice swelling of your face, hands, feet, or legs.   You get exposed to German measles and have never had them.   You are exposed to fifth disease or chickenpox.   You develop belly (abdominal) pain. Round ligament discomfort is a common non-cancerous (benign) cause of abdominal pain in pregnancy. Your caregiver still must evaluate you.   You develop a bad headache that does not go away.   You develop fever, diarrhea, pain with urination, or shortness of breath.   You develop visual problems, blurry, or double vision.   You fall or are in a car accident or any kind of trauma.   There is mental or physical violence at home.  Document Released: 12/05/2001 Document Revised: 11/30/2011 Document Reviewed: 06/09/2009 [employer] Patient Information 2012 [employer]. 

## 2012-07-11 NOTE — Progress Notes (Signed)
Patient doing well. Seen recently in [facility] for vaginal bleeding, reassured with [facility] and [place]. Minor spotting red/brown reported. No other complaints. Questions answered.

## 2012-07-11 NOTE — Addendum Note (Signed)
Addended by: Levie Heritage on: 07/11/2012 11:07 AM   Modules accepted: Orders, Level of Service, SmartSet

## 2012-07-12 LAB — LUPUS ANTICOAGULANT PANEL
DRVVT: 30.6 secs (ref <45.1)
Lupus Anticoagulant: NOT DETECTED
PTT Lupus Anticoagulant: 29.8 secs (ref 28.0–43.0)

## 2012-07-12 LAB — ANTITHROMBIN III: AntiThromb III Func: 91 % (ref 76–126)

## 2012-07-12 LAB — FACTOR 5 LEIDEN

## 2012-07-15 LAB — PROTHROMBIN GENE MUTATION

## 2012-07-18 LAB — CARDIOLIPIN ANTIBODIES, IGG, IGM, IGA
Anticardiolipin IgA: 10 APL U/mL (ref <22)
Anticardiolipin IgG: 9 GPL U/mL (ref <23)

## 2012-07-25 ENCOUNTER — Ambulatory Visit (INDEPENDENT_AMBULATORY_CARE_PROVIDER_SITE_OTHER): Payer: Medicaid Other | Admitting: Physician Assistant

## 2012-07-25 VITALS — BP 124/78 | Temp 98.8°F | Wt 265.1 lb

## 2012-07-25 DIAGNOSIS — O0990 Supervision of high risk pregnancy, unspecified, unspecified trimester: Secondary | ICD-10-CM

## 2012-07-25 DIAGNOSIS — O09299 Supervision of pregnancy with other poor reproductive or obstetric history, unspecified trimester: Secondary | ICD-10-CM

## 2012-07-25 LAB — POCT URINALYSIS DIP (DEVICE)
Ketones, ur: NEGATIVE mg/dL
Nitrite: NEGATIVE
Protein, ur: NEGATIVE mg/dL
Urobilinogen, UA: 0.2 mg/dL (ref 0.0–1.0)
pH: 8.5 — ABNORMAL HIGH (ref 5.0–8.0)

## 2012-07-25 NOTE — Patient Instructions (Signed)
Pregnancy - Second Trimester The second trimester of pregnancy (3 to 6 months) is a period of rapid growth for you and your baby. At the end of the sixth month, your baby is about 9 inches long and weighs 1 1/2 pounds. You will begin to feel the baby move between 18 and 20 weeks of the pregnancy. This is called quickening. Weight gain is faster. A clear fluid (colostrum) may leak out of your breasts. You may feel small contractions of the womb (uterus). This is known as false labor or Braxton-Hicks contractions. This is like a practice for labor when the baby is ready to be born. Usually, the problems with morning sickness have usually passed by the end of your first trimester. Some women develop small dark blotches (called cholasma, mask of pregnancy) on their face that usually goes away after the baby is born. Exposure to the sun makes the blotches worse. Acne may also develop in some pregnant women and pregnant women who have acne, may find that it goes away. PRENATAL EXAMS  Blood work may continue to be done during prenatal exams. These tests are done to check on your health and the probable health of your baby. Blood work is used to follow your blood levels (hemoglobin). Anemia (low hemoglobin) is common during pregnancy. Iron and vitamins are given to help prevent this. You will also be checked for diabetes between 24 and 28 weeks of the pregnancy. Some of the previous blood tests may be repeated.   The size of the uterus is measured during each visit. This is to make sure that the baby is continuing to grow properly according to the dates of the pregnancy.   Your blood pressure is checked every prenatal visit. This is to make sure you are not getting toxemia.   Your urine is checked to make sure you do not have an infection, diabetes or protein in the urine.   Your weight is checked often to make sure gains are happening at the suggested rate. This is to ensure that both you and your baby are  growing normally.   Sometimes, an ultrasound is performed to confirm the proper growth and development of the baby. This is a test which bounces harmless sound waves off the baby so your caregiver can more accurately determine due dates.  Sometimes, a specialized test is done on the amniotic fluid surrounding the baby. This test is called an amniocentesis. The amniotic fluid is obtained by sticking a needle into the belly (abdomen). This is done to check the chromosomes in instances where there is a concern about possible genetic problems with the baby. It is also sometimes done near the end of pregnancy if an early delivery is required. In this case, it is done to help make sure the baby's lungs are mature enough for the baby to live outside of the womb. CHANGES OCCURING IN THE SECOND TRIMESTER OF PREGNANCY Your body goes through many changes during pregnancy. They vary from person to person. Talk to your caregiver about changes you notice that you are concerned about.  During the second trimester, you will likely have an increase in your appetite. It is normal to have cravings for certain foods. This varies from person to person and pregnancy to pregnancy.   Your lower abdomen will begin to bulge.   You may have to urinate more often because the uterus and baby are pressing on your bladder. It is also common to get more bladder infections during pregnancy (  pain with urination). You can help this by drinking lots of fluids and emptying your bladder before and after intercourse.   You may begin to get stretch marks on your hips, abdomen, and breasts. These are normal changes in the body during pregnancy. There are no exercises or medications to take that prevent this change.   You may begin to develop swollen and bulging veins (varicose veins) in your legs. Wearing support hose, elevating your feet for 15 minutes, 3 to 4 times a day and limiting salt in your diet helps lessen the problem.    Heartburn may develop as the uterus grows and pushes up against the stomach. Antacids recommended by your caregiver helps with this problem. Also, eating smaller meals 4 to 5 times a day helps.   Constipation can be treated with a stool softener or adding bulk to your diet. Drinking lots of fluids, vegetables, fruits, and whole grains are helpful.   Exercising is also helpful. If you have been very active up until your pregnancy, most of these activities can be continued during your pregnancy. If you have been less active, it is helpful to start an exercise program such as walking.   Hemorrhoids (varicose veins in the rectum) may develop at the end of the second trimester. Warm sitz baths and hemorrhoid cream recommended by your caregiver helps hemorrhoid problems.   Backaches may develop during this time of your pregnancy. Avoid heavy lifting, wear low heal shoes and practice good posture to help with backache problems.   Some pregnant women develop tingling and numbness of their hand and fingers because of swelling and tightening of ligaments in the wrist (carpel tunnel syndrome). This goes away after the baby is born.   As your breasts enlarge, you may have to get a bigger bra. Get a comfortable, cotton, support bra. Do not get a nursing bra until the last month of the pregnancy if you will be nursing the baby.   You may get a dark line from your belly button to the pubic area called the linea nigra.   You may develop rosy cheeks because of increase blood flow to the face.   You may develop spider looking lines of the face, neck, arms and chest. These go away after the baby is born.  HOME CARE INSTRUCTIONS   It is extremely important to avoid all smoking, herbs, alcohol, and unprescribed drugs during your pregnancy. These chemicals affect the formation and growth of the baby. Avoid these chemicals throughout the pregnancy to ensure the delivery of a healthy infant.   Most of your home  care instructions are the same as suggested for the first trimester of your pregnancy. Keep your caregiver's appointments. Follow your caregiver's instructions regarding medication use, exercise and diet.   During pregnancy, you are providing food for you and your baby. Continue to eat regular, well-balanced meals. Choose foods such as meat, fish, milk and other low fat dairy products, vegetables, fruits, and whole-grain breads and cereals. Your caregiver will tell you of the ideal weight gain.   A physical sexual relationship may be continued up until near the end of pregnancy if there are no other problems. Problems could include early (premature) leaking of amniotic fluid from the membranes, vaginal bleeding, abdominal pain, or other medical or pregnancy problems.   Exercise regularly if there are no restrictions. Check with your caregiver if you are unsure of the safety of some of your exercises. The greatest weight gain will occur in the   last 2 trimesters of pregnancy. Exercise will help you:   Control your weight.   Get you in shape for labor and delivery.   Lose weight after you have the baby.   Wear a good support or jogging bra for breast tenderness during pregnancy. This may help if worn during sleep. Pads or tissues may be used in the bra if you are leaking colostrum.   Do not use hot tubs, steam rooms or saunas throughout the pregnancy.   Wear your seat belt at all times when driving. This protects you and your baby if you are in an accident.   Avoid raw meat, uncooked cheese, cat litter boxes and soil used by cats. These carry germs that can cause birth defects in the baby.   The second trimester is also a good time to visit your dentist for your dental health if this has not been done yet. Getting your teeth cleaned is OK. Use a soft toothbrush. Brush gently during pregnancy.   It is easier to loose urine during pregnancy. Tightening up and strengthening the pelvic muscles will  help with this problem. Practice stopping your urination while you are going to the bathroom. These are the same muscles you need to strengthen. It is also the muscles you would use as if you were trying to stop from passing gas. You can practice tightening these muscles up 10 times a set and repeating this about 3 times per day. Once you know what muscles to tighten up, do not perform these exercises during urination. It is more likely to contribute to an infection by backing up the urine.   Ask for help if you have financial, counseling or nutritional needs during pregnancy. Your caregiver will be able to offer counseling for these needs as well as refer you for other special needs.   Your skin may become oily. If so, wash your face with mild soap, use non-greasy moisturizer and oil or cream based makeup.  MEDICATIONS AND DRUG USE IN PREGNANCY  Take prenatal vitamins as directed. The vitamin should contain 1 milligram of folic acid. Keep all vitamins out of reach of children. Only a couple vitamins or tablets containing iron may be fatal to a baby or young child when ingested.   Avoid use of all medications, including herbs, over-the-counter medications, not prescribed or suggested by your caregiver. Only take over-the-counter or prescription medicines for pain, discomfort, or fever as directed by your caregiver. Do not use aspirin.   Let your caregiver also know about herbs you may be using.   Alcohol is related to a number of birth defects. This includes fetal alcohol syndrome. All alcohol, in any form, should be avoided completely. Smoking will cause low birth rate and premature babies.   Street or illegal drugs are very harmful to the baby. They are absolutely forbidden. A baby born to an addicted mother will be addicted at birth. The baby will go through the same withdrawal an adult does.  SEEK MEDICAL CARE IF:  You have any concerns or worries during your pregnancy. It is better to call with  your questions if you feel they cannot wait, rather than worry about them. SEEK IMMEDIATE MEDICAL CARE IF:   An unexplained oral temperature above 102 F (38.9 C) develops, or as your caregiver suggests.   You have leaking of fluid from the vagina (birth canal). If leaking membranes are suspected, take your temperature and tell your caregiver of this when you call.   There   is vaginal spotting, bleeding, or passing clots. Tell your caregiver of the amount and how many pads are used. Light spotting in pregnancy is common, especially following intercourse.   You develop a bad smelling vaginal discharge with a change in the color from clear to white.   You continue to feel sick to your stomach (nauseated) and have no relief from remedies suggested. You vomit blood or coffee ground-like materials.   You lose more than 2 pounds of weight or gain more than 2 pounds of weight over 1 week, or as suggested by your caregiver.   You notice swelling of your face, hands, feet, or legs.   You get exposed to German measles and have never had them.   You are exposed to fifth disease or chickenpox.   You develop belly (abdominal) pain. Round ligament discomfort is a common non-cancerous (benign) cause of abdominal pain in pregnancy. Your caregiver still must evaluate you.   You develop a bad headache that does not go away.   You develop fever, diarrhea, pain with urination, or shortness of breath.   You develop visual problems, blurry, or double vision.   You fall or are in a car accident or any kind of trauma.   There is mental or physical violence at home.  Document Released: 12/05/2001 Document Revised: 11/30/2011 Document Reviewed: 06/09/2009 [employer] Patient Information 2012 [employer]. 

## 2012-07-25 NOTE — Progress Notes (Signed)
P=80c/o  pressure in abd for a few days, states relieved with tums, states vaginal bleeding stopped last week on July 24th. Discussed weight gain with patient as she has gained the reccomended 20 lb gain already- offered nutritionist, declined at this time because states has gained all that she lost from the beginning of pregnancy

## 2012-07-25 NOTE — Progress Notes (Signed)
Upon further investigation into pt history unclear of recurrent SAB history. Pt reports longstanding hx of irregular periods. "SAB" occurred ~ [redacted] week gestation, undocumented. States occurred at home, no [place] or MD visits confirming pregnancy. Last pregnancy missed AB at 8weeks with D&C. Will move to [facility] clinic.

## 2012-08-14 ENCOUNTER — Ambulatory Visit (INDEPENDENT_AMBULATORY_CARE_PROVIDER_SITE_OTHER): Payer: Medicaid Other | Admitting: Physician Assistant

## 2012-08-14 VITALS — BP 130/85 | Temp 97.0°F | Wt 261.8 lb

## 2012-08-14 DIAGNOSIS — O09299 Supervision of pregnancy with other poor reproductive or obstetric history, unspecified trimester: Secondary | ICD-10-CM

## 2012-08-14 DIAGNOSIS — Z3480 Encounter for supervision of other normal pregnancy, unspecified trimester: Secondary | ICD-10-CM

## 2012-08-14 LAB — POCT URINALYSIS DIP (DEVICE)
Glucose, UA: NEGATIVE mg/dL
Nitrite: NEGATIVE
Protein, ur: 30 mg/dL — AB
Urobilinogen, UA: 0.2 mg/dL (ref 0.0–1.0)

## 2012-08-14 NOTE — Progress Notes (Signed)
P = 80 Pressure across abdomen

## 2012-08-14 NOTE — Patient Instructions (Addendum)
Pregnancy - Second Trimester The second trimester of pregnancy (3 to 6 months) is a period of rapid growth for you and your baby. At the end of the sixth month, your baby is about 9 inches long and weighs 1 1/2 pounds. You will begin to feel the baby move between 18 and 20 weeks of the pregnancy. This is called quickening. Weight gain is faster. A clear fluid (colostrum) may leak out of your breasts. You may feel small contractions of the womb (uterus). This is known as false labor or Braxton-Hicks contractions. This is like a practice for labor when the baby is ready to be born. Usually, the problems with morning sickness have usually passed by the end of your first trimester. Some women develop small dark blotches (called cholasma, mask of pregnancy) on their face that usually goes away after the baby is born. Exposure to the sun makes the blotches worse. Acne may also develop in some pregnant women and pregnant women who have acne, may find that it goes away. PRENATAL EXAMS  Blood work may continue to be done during prenatal exams. These tests are done to check on your health and the probable health of your baby. Blood work is used to follow your blood levels (hemoglobin). Anemia (low hemoglobin) is common during pregnancy. Iron and vitamins are given to help prevent this. You will also be checked for diabetes between 24 and 28 weeks of the pregnancy. Some of the previous blood tests may be repeated.   The size of the uterus is measured during each visit. This is to make sure that the baby is continuing to grow properly according to the dates of the pregnancy.   Your blood pressure is checked every prenatal visit. This is to make sure you are not getting toxemia.   Your urine is checked to make sure you do not have an infection, diabetes or protein in the urine.   Your weight is checked often to make sure gains are happening at the suggested rate. This is to ensure that both you and your baby are  growing normally.   Sometimes, an ultrasound is performed to confirm the proper growth and development of the baby. This is a test which bounces harmless sound waves off the baby so your caregiver can more accurately determine due dates.  Sometimes, a specialized test is done on the amniotic fluid surrounding the baby. This test is called an amniocentesis. The amniotic fluid is obtained by sticking a needle into the belly (abdomen). This is done to check the chromosomes in instances where there is a concern about possible genetic problems with the baby. It is also sometimes done near the end of pregnancy if an early delivery is required. In this case, it is done to help make sure the baby's lungs are mature enough for the baby to live outside of the womb. CHANGES OCCURING IN THE SECOND TRIMESTER OF PREGNANCY Your body goes through many changes during pregnancy. They vary from person to person. Talk to your caregiver about changes you notice that you are concerned about.  During the second trimester, you will likely have an increase in your appetite. It is normal to have cravings for certain foods. This varies from person to person and pregnancy to pregnancy.   Your lower abdomen will begin to bulge.   You may have to urinate more often because the uterus and baby are pressing on your bladder. It is also common to get more bladder infections during pregnancy (  pain with urination). You can help this by drinking lots of fluids and emptying your bladder before and after intercourse.   You may begin to get stretch marks on your hips, abdomen, and breasts. These are normal changes in the body during pregnancy. There are no exercises or medications to take that prevent this change.   You may begin to develop swollen and bulging veins (varicose veins) in your legs. Wearing support hose, elevating your feet for 15 minutes, 3 to 4 times a day and limiting salt in your diet helps lessen the problem.    Heartburn may develop as the uterus grows and pushes up against the stomach. Antacids recommended by your caregiver helps with this problem. Also, eating smaller meals 4 to 5 times a day helps.   Constipation can be treated with a stool softener or adding bulk to your diet. Drinking lots of fluids, vegetables, fruits, and whole grains are helpful.   Exercising is also helpful. If you have been very active up until your pregnancy, most of these activities can be continued during your pregnancy. If you have been less active, it is helpful to start an exercise program such as walking.   Hemorrhoids (varicose veins in the rectum) may develop at the end of the second trimester. Warm sitz baths and hemorrhoid cream recommended by your caregiver helps hemorrhoid problems.   Backaches may develop during this time of your pregnancy. Avoid heavy lifting, wear low heal shoes and practice good posture to help with backache problems.   Some pregnant women develop tingling and numbness of their hand and fingers because of swelling and tightening of ligaments in the wrist (carpel tunnel syndrome). This goes away after the baby is born.   As your breasts enlarge, you may have to get a bigger bra. Get a comfortable, cotton, support bra. Do not get a nursing bra until the last month of the pregnancy if you will be nursing the baby.   You may get a dark line from your belly button to the pubic area called the linea nigra.   You may develop rosy cheeks because of increase blood flow to the face.   You may develop spider looking lines of the face, neck, arms and chest. These go away after the baby is born.  HOME CARE INSTRUCTIONS   It is extremely important to avoid all smoking, herbs, alcohol, and unprescribed drugs during your pregnancy. These chemicals affect the formation and growth of the baby. Avoid these chemicals throughout the pregnancy to ensure the delivery of a healthy infant.   Most of your home  care instructions are the same as suggested for the first trimester of your pregnancy. Keep your caregiver's appointments. Follow your caregiver's instructions regarding medication use, exercise and diet.   During pregnancy, you are providing food for you and your baby. Continue to eat regular, well-balanced meals. Choose foods such as meat, fish, milk and other low fat dairy products, vegetables, fruits, and whole-grain breads and cereals. Your caregiver will tell you of the ideal weight gain.   A physical sexual relationship may be continued up until near the end of pregnancy if there are no other problems. Problems could include early (premature) leaking of amniotic fluid from the membranes, vaginal bleeding, abdominal pain, or other medical or pregnancy problems.   Exercise regularly if there are no restrictions. Check with your caregiver if you are unsure of the safety of some of your exercises. The greatest weight gain will occur in the   last 2 trimesters of pregnancy. Exercise will help you:   Control your weight.   Get you in shape for labor and delivery.   Lose weight after you have the baby.   Wear a good support or jogging bra for breast tenderness during pregnancy. This may help if worn during sleep. Pads or tissues may be used in the bra if you are leaking colostrum.   Do not use hot tubs, steam rooms or saunas throughout the pregnancy.   Wear your seat belt at all times when driving. This protects you and your baby if you are in an accident.   Avoid raw meat, uncooked cheese, cat litter boxes and soil used by cats. These carry germs that can cause birth defects in the baby.   The second trimester is also a good time to visit your dentist for your dental health if this has not been done yet. Getting your teeth cleaned is OK. Use a soft toothbrush. Brush gently during pregnancy.   It is easier to loose urine during pregnancy. Tightening up and strengthening the pelvic muscles will  help with this problem. Practice stopping your urination while you are going to the bathroom. These are the same muscles you need to strengthen. It is also the muscles you would use as if you were trying to stop from passing gas. You can practice tightening these muscles up 10 times a set and repeating this about 3 times per day. Once you know what muscles to tighten up, do not perform these exercises during urination. It is more likely to contribute to an infection by backing up the urine.   Ask for help if you have financial, counseling or nutritional needs during pregnancy. Your caregiver will be able to offer counseling for these needs as well as refer you for other special needs.   Your skin may become oily. If so, wash your face with mild soap, use non-greasy moisturizer and oil or cream based makeup.  MEDICATIONS AND DRUG USE IN PREGNANCY  Take prenatal vitamins as directed. The vitamin should contain 1 milligram of folic acid. Keep all vitamins out of reach of children. Only a couple vitamins or tablets containing iron may be fatal to a baby or young child when ingested.   Avoid use of all medications, including herbs, over-the-counter medications, not prescribed or suggested by your caregiver. Only take over-the-counter or prescription medicines for pain, discomfort, or fever as directed by your caregiver. Do not use aspirin.   Let your caregiver also know about herbs you may be using.   Alcohol is related to a number of birth defects. This includes fetal alcohol syndrome. All alcohol, in any form, should be avoided completely. Smoking will cause low birth rate and premature babies.   Street or illegal drugs are very harmful to the baby. They are absolutely forbidden. A baby born to an addicted mother will be addicted at birth. The baby will go through the same withdrawal an adult does.  SEEK MEDICAL CARE IF:  You have any concerns or worries during your pregnancy. It is better to call with  your questions if you feel they cannot wait, rather than worry about them. SEEK IMMEDIATE MEDICAL CARE IF:   An unexplained oral temperature above 102 F (38.9 C) develops, or as your caregiver suggests.   You have leaking of fluid from the vagina (birth canal). If leaking membranes are suspected, take your temperature and tell your caregiver of this when you call.   There   is vaginal spotting, bleeding, or passing clots. Tell your caregiver of the amount and how many pads are used. Light spotting in pregnancy is common, especially following intercourse.   You develop a bad smelling vaginal discharge with a change in the color from clear to white.   You continue to feel sick to your stomach (nauseated) and have no relief from remedies suggested. You vomit blood or coffee ground-like materials.   You lose more than 2 pounds of weight or gain more than 2 pounds of weight over 1 week, or as suggested by your caregiver.   You notice swelling of your face, hands, feet, or legs.   You get exposed to German measles and have never had them.   You are exposed to fifth disease or chickenpox.   You develop belly (abdominal) pain. Round ligament discomfort is a common non-cancerous (benign) cause of abdominal pain in pregnancy. Your caregiver still must evaluate you.   You develop a bad headache that does not go away.   You develop fever, diarrhea, pain with urination, or shortness of breath.   You develop visual problems, blurry, or double vision.   You fall or are in a car accident or any kind of trauma.   There is mental or physical violence at home.  Document Released: 12/05/2001 Document Revised: 11/30/2011 Document Reviewed: 06/09/2009 [employer] Patient Information 2012 [employer]. 

## 2012-08-14 NOTE — Progress Notes (Signed)
No complaints. +FM. Anatomy US scheduled. QUAD screen today.

## 2012-08-15 ENCOUNTER — Encounter: Payer: Medicaid Other | Admitting: Obstetrics and Gynecology

## 2012-08-15 LAB — AFP TUMOR MARKER: AFP-Tumor Marker: 52.1 ng/mL — ABNORMAL HIGH (ref 0.0–8.0)

## 2012-08-16 ENCOUNTER — Ambulatory Visit (HOSPITAL_COMMUNITY)
Admission: RE | Admit: 2012-08-16 | Discharge: 2012-08-16 | Disposition: A | Discharge status: Home / Self Care | Payer: Medicaid Other | Source: Ambulatory Visit | Attending: Physician Assistant | Admitting: Physician Assistant

## 2012-08-16 DIAGNOSIS — O2620 Pregnancy care for patient with recurrent pregnancy loss, unspecified trimester: Secondary | ICD-10-CM | POA: Insufficient documentation

## 2012-08-16 DIAGNOSIS — Z1389 Encounter for screening for other disorder: Secondary | ICD-10-CM | POA: Insufficient documentation

## 2012-08-16 DIAGNOSIS — O358XX0 Maternal care for other (suspected) fetal abnormality and damage, not applicable or unspecified: Secondary | ICD-10-CM | POA: Insufficient documentation

## 2012-08-16 DIAGNOSIS — E669 Obesity, unspecified: Secondary | ICD-10-CM | POA: Insufficient documentation

## 2012-08-16 DIAGNOSIS — O99210 Obesity complicating pregnancy, unspecified trimester: Secondary | ICD-10-CM | POA: Insufficient documentation

## 2012-08-16 DIAGNOSIS — Z363 Encounter for antenatal screening for malformations: Secondary | ICD-10-CM | POA: Insufficient documentation

## 2012-08-16 LAB — CULTURE, OB URINE: Colony Count: >=100000

## 2012-08-18 ENCOUNTER — Encounter: Payer: Self-pay | Admitting: Physician Assistant

## 2012-08-18 ENCOUNTER — Other Ambulatory Visit: Payer: Self-pay | Admitting: Physician Assistant

## 2012-08-18 DIAGNOSIS — Z3480 Encounter for supervision of other normal pregnancy, unspecified trimester: Secondary | ICD-10-CM

## 2012-08-19 ENCOUNTER — Telehealth: Payer: Self-pay

## 2012-08-19 NOTE — Telephone Encounter (Signed)
Message copied by Faythe Casa on Mon Aug 19, 2012  8:38 AM ------      Message from: August Luz      Created: Sun Aug 18, 2012  8:22 AM       Please schedule FU [place] for 2-3 weeks and call patient with date/time of appt. Order entered.

## 2012-08-19 NOTE — Telephone Encounter (Signed)
Called [place] scheduled appt for September 11 @ 215pm.  Called pt and informed pt of the need for a follow up US and appt.  Pt did not have any further questions and stated understanding.

## 2012-08-27 ENCOUNTER — Telehealth: Payer: Self-pay | Admitting: Medical

## 2012-08-27 NOTE — Telephone Encounter (Signed)
Called [phone] and number was disconnected.  Called emergency contact # and he gave me [phone] to contact pt.  Called pt @ [phone] and informed pt that she could take otc vitamin B6 100 mg twice a day and if her symptoms has not subsided then a provider could prescribe her something else to relieve the symptoms at her next appt scheduled 09/11/12 @ 1015am.  Pt stated "thank you" and had no further questions.

## 2012-08-27 NOTE — Telephone Encounter (Signed)
Patient called stating that she is vomiting a lot and would like a Rx to help with this.

## 2012-09-04 ENCOUNTER — Ambulatory Visit (HOSPITAL_COMMUNITY): Payer: Medicaid Other

## 2012-09-08 ENCOUNTER — Encounter (HOSPITAL_COMMUNITY): Payer: Self-pay | Admitting: Emergency Medicine

## 2012-09-08 ENCOUNTER — Emergency Department (HOSPITAL_COMMUNITY)
Admission: EM | Admit: 2012-09-08 | Discharge: 2012-09-08 | Disposition: A | Discharge status: Home / Self Care | Payer: Medicaid Other | Attending: Emergency Medicine | Admitting: Emergency Medicine

## 2012-09-08 DIAGNOSIS — Z87891 Personal history of nicotine dependence: Secondary | ICD-10-CM | POA: Insufficient documentation

## 2012-09-08 DIAGNOSIS — D649 Anemia, unspecified: Secondary | ICD-10-CM | POA: Insufficient documentation

## 2012-09-08 DIAGNOSIS — L03319 Cellulitis of trunk, unspecified: Secondary | ICD-10-CM | POA: Insufficient documentation

## 2012-09-08 DIAGNOSIS — O99019 Anemia complicating pregnancy, unspecified trimester: Secondary | ICD-10-CM | POA: Insufficient documentation

## 2012-09-08 DIAGNOSIS — L02219 Cutaneous abscess of trunk, unspecified: Secondary | ICD-10-CM | POA: Insufficient documentation

## 2012-09-08 DIAGNOSIS — Z3490 Encounter for supervision of normal pregnancy, unspecified, unspecified trimester: Secondary | ICD-10-CM

## 2012-09-08 DIAGNOSIS — O99891 Other specified diseases and conditions complicating pregnancy: Secondary | ICD-10-CM | POA: Insufficient documentation

## 2012-09-08 MED ORDER — LIDOCAINE HCL 2 % IJ SOLN
5.0000 mL | Freq: Once | INTRAMUSCULAR | Status: AC
  Administered 2012-09-08: 100 mg

## 2012-09-08 MED ORDER — OXYCODONE-ACETAMINOPHEN 5-325 MG PO TABS: 1.0000 | ORAL_TABLET | ORAL | Status: AC | PRN

## 2012-09-08 MED ORDER — SULFAMETHOXAZOLE-TRIMETHOPRIM 800-160 MG PO TABS: 1.0000 | ORAL_TABLET | Freq: Two times a day (BID) | ORAL | Status: AC

## 2012-09-08 NOTE — ED Provider Notes (Signed)
History     CSN: [id number]  Arrival date & time 09/08/12  9629   First MD Initiated Contact with Patient 09/08/12 [phone]      Chief Complaint  Patient presents with  . Wound Check    (Consider location/radiation/quality/duration/timing/severity/associated sxs/prior treatment) Patient is a 31 y.o. female presenting with wound check. The history is provided by the patient.  Wound Check   She is [redacted] weeks pregnant and noted onset 3 days ago of an abscess in the right suprapubic area. She treated it with alternating warm compresses in cold compresses and is worse. She denies fever, chills, sweats. Pain is severe and she rates it at 9/10. Pain is worse with movement at which time pain reaches 10/10.  Past Medical History  Diagnosis Date  . Anemia   . 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 Dilation and curettage of uterus   . Colposcopy 12/2011    Family History  Problem Relation Age of Onset  . Heart disease Mother     had heart attack with stents placed  . Hypertension Mother   . Deep vein thrombosis Mother   . Hypertension Father   . Diabetes Father   . Asthma Brother     History  Substance Use Topics  . Smoking status: Former Smoker -- 0.3 packs/day    Types: Cigarettes    Quit date: 05/20/2012  . Smokeless tobacco: Never Used  . Alcohol Use: No     occasionally    OB History    Grav Para Term Preterm Abortions TAB SAB Ect Mult Living   10    9 1 8          Review of Systems  All other systems reviewed and are negative.    Allergies  Codeine and Latex  Home Medications   Current Outpatient Rx  Name Route Sig Dispense Refill  . CALCIUM CARBONATE ANTACID 500 MG PO CHEW Oral Chew 1 tablet by mouth daily.    Marland Kitchen PRENATAL MULTIVITAMIN CH Oral Take 1 tablet by mouth daily.      BP 106/85  Pulse 105  Temp 98 F (36.7 C)  Resp 16  SpO2 99%  LMP 03/25/2012  Physical Exam  Nursing note and vitals  reviewed. 31year old ffemale, resting comfortably and in no acute distress. Vital signs are significant for borderline tachycardia with heart rate 105-may be normal in pregnancy. Oxygen saturation is 99%, which is normal. Head is normocephalic and atraumatic. PERRLA, EOMI. Oropharynx is clear. Neck is nontender and supple without adenopathy or JVD. Back is nontender and there is no CVA tenderness. Lungs are clear without rales, wheezes, or rhonchi. Chest is nontender. Heart has regular rate and rhythm with 2/6 systolic ejection murmur. Abdomen is soft, gravid uterus present with fundal height consistent with stated duration of pregnancy, nontender without other masses or hepatosplenomegaly and peristalsis is normoactive. Abscess is present in the right suprapubic area. Extremities have no cyanosis or edema, full range of motion is present. Skin is warm and dry without rash. Neurologic: Mental status is normal, cranial nerves are intact, there are no motor or sensory deficits.   ED Course  Procedures (including critical care time)  INCISION AND DRAINAGE Performed by: XLKGM,[email or web address] Consent: Verbal consent obtained. Risks and benefits: risks, benefits and alternatives were discussed Type: abscess  Body area: suprapubic  Anesthesia: local infiltration  Local anesthetic: lidocaine 2% without epinephrine  Anesthetic total:  4 ml  Complexity: complex Blunt dissection to break up loculations  Drainage: purulent, foul-smelling  Drainage amount: large  Packing material: 1/4 in iodoform gauze  Patient tolerance: Patient tolerated the procedure well with no immediate complications.     1. Suprapubic abscess   2. Pregnancy       MDM  Suprapubic abscess which will need incision and drainage. Second trimester pregnancy.  Abscess was incised and drained with large amount of foul-smelling purulent material being obtained. She states she had an abscess in the same area about 6 months  ago. She is sent home with prescription for Bactrim DS and Percocet for pain. Both should be acceptable for her stage of pregnancy.        Dione Booze, MD 09/08/12 [phone]

## 2012-09-09 ENCOUNTER — Ambulatory Visit (HOSPITAL_COMMUNITY): Payer: Medicaid Other

## 2012-09-11 ENCOUNTER — Encounter (HOSPITAL_COMMUNITY): Payer: Self-pay | Admitting: Emergency Medicine

## 2012-09-11 ENCOUNTER — Emergency Department (HOSPITAL_COMMUNITY)
Admission: EM | Admit: 2012-09-11 | Discharge: 2012-09-11 | Disposition: A | Discharge status: Home / Self Care | Payer: Medicaid Other | Attending: Emergency Medicine | Admitting: Emergency Medicine

## 2012-09-11 ENCOUNTER — Ambulatory Visit (INDEPENDENT_AMBULATORY_CARE_PROVIDER_SITE_OTHER): Payer: Medicaid Other | Admitting: Advanced Practice Midwife

## 2012-09-11 VITALS — BP 137/83 | Temp 98.7°F | Wt 262.6 lb

## 2012-09-11 DIAGNOSIS — Z5189 Encounter for other specified aftercare: Secondary | ICD-10-CM | POA: Insufficient documentation

## 2012-09-11 DIAGNOSIS — L0291 Cutaneous abscess, unspecified: Secondary | ICD-10-CM

## 2012-09-11 DIAGNOSIS — O09299 Supervision of pregnancy with other poor reproductive or obstetric history, unspecified trimester: Secondary | ICD-10-CM

## 2012-09-11 NOTE — Patient Instructions (Addendum)
Pregnancy - Second Trimester The second trimester of pregnancy (3 to 6 months) is a period of rapid growth for you and your baby. At the end of the sixth month, your baby is about 9 inches long and weighs 1 1/2 pounds. You will begin to feel the baby move between 18 and 20 weeks of the pregnancy. This is called quickening. Weight gain is faster. A clear fluid (colostrum) may leak out of your breasts. You may feel small contractions of the womb (uterus). This is known as false labor or Braxton-Hicks contractions. This is like a practice for labor when the baby is ready to be born. Usually, the problems with morning sickness have usually passed by the end of your first trimester. Some women develop small dark blotches (called cholasma, mask of pregnancy) on their face that usually goes away after the baby is born. Exposure to the sun makes the blotches worse. Acne may also develop in some pregnant women and pregnant women who have acne, may find that it goes away. PRENATAL EXAMS  Blood work may continue to be done during prenatal exams. These tests are done to check on your health and the probable health of your baby. Blood work is used to follow your blood levels (hemoglobin). Anemia (low hemoglobin) is common during pregnancy. Iron and vitamins are given to help prevent this. You will also be checked for diabetes between 24 and 28 weeks of the pregnancy. Some of the previous blood tests may be repeated.   The size of the uterus is measured during each visit. This is to make sure that the baby is continuing to grow properly according to the dates of the pregnancy.   Your blood pressure is checked every prenatal visit. This is to make sure you are not getting toxemia.   Your urine is checked to make sure you do not have an infection, diabetes or protein in the urine.   Your weight is checked often to make sure gains are happening at the suggested rate. This is to ensure that both you and your baby are  growing normally.   Sometimes, an ultrasound is performed to confirm the proper growth and development of the baby. This is a test which bounces harmless sound waves off the baby so your caregiver can more accurately determine due dates.  Sometimes, a specialized test is done on the amniotic fluid surrounding the baby. This test is called an amniocentesis. The amniotic fluid is obtained by sticking a needle into the belly (abdomen). This is done to check the chromosomes in instances where there is a concern about possible genetic problems with the baby. It is also sometimes done near the end of pregnancy if an early delivery is required. In this case, it is done to help make sure the baby's lungs are mature enough for the baby to live outside of the womb. CHANGES OCCURING IN THE SECOND TRIMESTER OF PREGNANCY Your body goes through many changes during pregnancy. They vary from person to person. Talk to your caregiver about changes you notice that you are concerned about.  During the second trimester, you will likely have an increase in your appetite. It is normal to have cravings for certain foods. This varies from person to person and pregnancy to pregnancy.   Your lower abdomen will begin to bulge.   You may have to urinate more often because the uterus and baby are pressing on your bladder. It is also common to get more bladder infections during pregnancy (  pain with urination). You can help this by drinking lots of fluids and emptying your bladder before and after intercourse.   You may begin to get stretch marks on your hips, abdomen, and breasts. These are normal changes in the body during pregnancy. There are no exercises or medications to take that prevent this change.   You may begin to develop swollen and bulging veins (varicose veins) in your legs. Wearing support hose, elevating your feet for 15 minutes, 3 to 4 times a day and limiting salt in your diet helps lessen the problem.    Heartburn may develop as the uterus grows and pushes up against the stomach. Antacids recommended by your caregiver helps with this problem. Also, eating smaller meals 4 to 5 times a day helps.   Constipation can be treated with a stool softener or adding bulk to your diet. Drinking lots of fluids, vegetables, fruits, and whole grains are helpful.   Exercising is also helpful. If you have been very active up until your pregnancy, most of these activities can be continued during your pregnancy. If you have been less active, it is helpful to start an exercise program such as walking.   Hemorrhoids (varicose veins in the rectum) may develop at the end of the second trimester. Warm sitz baths and hemorrhoid cream recommended by your caregiver helps hemorrhoid problems.   Backaches may develop during this time of your pregnancy. Avoid heavy lifting, wear low heal shoes and practice good posture to help with backache problems.   Some pregnant women develop tingling and numbness of their hand and fingers because of swelling and tightening of ligaments in the wrist (carpel tunnel syndrome). This goes away after the baby is born.   As your breasts enlarge, you may have to get a bigger bra. Get a comfortable, cotton, support bra. Do not get a nursing bra until the last month of the pregnancy if you will be nursing the baby.   You may get a dark line from your belly button to the pubic area called the linea nigra.   You may develop rosy cheeks because of increase blood flow to the face.   You may develop spider looking lines of the face, neck, arms and chest. These go away after the baby is born.  HOME CARE INSTRUCTIONS   It is extremely important to avoid all smoking, herbs, alcohol, and unprescribed drugs during your pregnancy. These chemicals affect the formation and growth of the baby. Avoid these chemicals throughout the pregnancy to ensure the delivery of a healthy infant.   Most of your home  care instructions are the same as suggested for the first trimester of your pregnancy. Keep your caregiver's appointments. Follow your caregiver's instructions regarding medication use, exercise and diet.   During pregnancy, you are providing food for you and your baby. Continue to eat regular, well-balanced meals. Choose foods such as meat, fish, milk and other low fat dairy products, vegetables, fruits, and whole-grain breads and cereals. Your caregiver will tell you of the ideal weight gain.   A physical sexual relationship may be continued up until near the end of pregnancy if there are no other problems. Problems could include early (premature) leaking of amniotic fluid from the membranes, vaginal bleeding, abdominal pain, or other medical or pregnancy problems.   Exercise regularly if there are no restrictions. Check with your caregiver if you are unsure of the safety of some of your exercises. The greatest weight gain will occur in the   last 2 trimesters of pregnancy. Exercise will help you:   Control your weight.   Get you in shape for labor and delivery.   Lose weight after you have the baby.   Wear a good support or jogging bra for breast tenderness during pregnancy. This may help if worn during sleep. Pads or tissues may be used in the bra if you are leaking colostrum.   Do not use hot tubs, steam rooms or saunas throughout the pregnancy.   Wear your seat belt at all times when driving. This protects you and your baby if you are in an accident.   Avoid raw meat, uncooked cheese, cat litter boxes and soil used by cats. These carry germs that can cause birth defects in the baby.   The second trimester is also a good time to visit your dentist for your dental health if this has not been done yet. Getting your teeth cleaned is OK. Use a soft toothbrush. Brush gently during pregnancy.   It is easier to loose urine during pregnancy. Tightening up and strengthening the pelvic muscles will  help with this problem. Practice stopping your urination while you are going to the bathroom. These are the same muscles you need to strengthen. It is also the muscles you would use as if you were trying to stop from passing gas. You can practice tightening these muscles up 10 times a set and repeating this about 3 times per day. Once you know what muscles to tighten up, do not perform these exercises during urination. It is more likely to contribute to an infection by backing up the urine.   Ask for help if you have financial, counseling or nutritional needs during pregnancy. Your caregiver will be able to offer counseling for these needs as well as refer you for other special needs.   Your skin may become oily. If so, wash your face with mild soap, use non-greasy moisturizer and oil or cream based makeup.  MEDICATIONS AND DRUG USE IN PREGNANCY  Take prenatal vitamins as directed. The vitamin should contain 1 milligram of folic acid. Keep all vitamins out of reach of children. Only a couple vitamins or tablets containing iron may be fatal to a baby or young child when ingested.   Avoid use of all medications, including herbs, over-the-counter medications, not prescribed or suggested by your caregiver. Only take over-the-counter or prescription medicines for pain, discomfort, or fever as directed by your caregiver. Do not use aspirin.   Let your caregiver also know about herbs you may be using.   Alcohol is related to a number of birth defects. This includes fetal alcohol syndrome. All alcohol, in any form, should be avoided completely. Smoking will cause low birth rate and premature babies.   Street or illegal drugs are very harmful to the baby. They are absolutely forbidden. A baby born to an addicted mother will be addicted at birth. The baby will go through the same withdrawal an adult does.  SEEK MEDICAL CARE IF:  You have any concerns or worries during your pregnancy. It is better to call with  your questions if you feel they cannot wait, rather than worry about them. SEEK IMMEDIATE MEDICAL CARE IF:   An unexplained oral temperature above 102 F (38.9 C) develops, or as your caregiver suggests.   You have leaking of fluid from the vagina (birth canal). If leaking membranes are suspected, take your temperature and tell your caregiver of this when you call.   There   is vaginal spotting, bleeding, or passing clots. Tell your caregiver of the amount and how many pads are used. Light spotting in pregnancy is common, especially following intercourse.   You develop a bad smelling vaginal discharge with a change in the color from clear to white.   You continue to feel sick to your stomach (nauseated) and have no relief from remedies suggested. You vomit blood or coffee ground-like materials.   You lose more than 2 pounds of weight or gain more than 2 pounds of weight over 1 week, or as suggested by your caregiver.   You notice swelling of your face, hands, feet, or legs.   You get exposed to German measles and have never had them.   You are exposed to fifth disease or chickenpox.   You develop belly (abdominal) pain. Round ligament discomfort is a common non-cancerous (benign) cause of abdominal pain in pregnancy. Your caregiver still must evaluate you.   You develop a bad headache that does not go away.   You develop fever, diarrhea, pain with urination, or shortness of breath.   You develop visual problems, blurry, or double vision.   You fall or are in a car accident or any kind of trauma.   There is mental or physical violence at home.  Document Released: 12/05/2001 Document Revised: 11/30/2011 Document Reviewed: 06/09/2009 [employer] Patient Information 2012 [employer]. 

## 2012-09-11 NOTE — ED Notes (Signed)
Proper wound care and dressing changes reviewed with pt. Pt will follow up with PCP or return to ED as needed

## 2012-09-11 NOTE — Progress Notes (Signed)
Pulse: 83 Pt was given a rx for septra and for percocet. She wanted to discuss with a provider here if it is safe for her to take, she has an abscess on her abdomen.

## 2012-09-11 NOTE — ED Notes (Signed)
Wound on r/lower abd post I and D. Drainage remains malodorous and cloudy

## 2012-09-11 NOTE — ED Provider Notes (Signed)
History     CSN: [id number]  Arrival date & time 09/11/12  1221   First MD Initiated Contact with Patient 09/11/12 1410      Chief Complaint  Patient presents with  . Wound Check    packing in r/abd    (Consider location/radiation/quality/duration/timing/severity/associated sxs/prior treatment) HPI Comments: Wound check. Right inguinal area abscess. Drained 3 days ago. Antibiotics not taken because she was going to confirm with OBGYN for taking bactrim. He OK'd it yesterday. Patient will start taking antibiotics (Bactrim). Patient complains of pain at the wound site and purulent drainage that never resolved since being drained 2 days ago.   Patient is a 31 y.o. female presenting with wound check.  Wound Check     Past Medical History  Diagnosis Date  . Anemia   . 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 Dilation and curettage of uterus   . Colposcopy 12/2011    Family History  Problem Relation Age of Onset  . Heart disease Mother     had heart attack with stents placed  . Hypertension Mother   . Deep vein thrombosis Mother   . Hypertension Father   . Diabetes Father   . Asthma Brother     History  Substance Use Topics  . Smoking status: Former Smoker -- 0.3 packs/day    Types: Cigarettes    Quit date: 05/20/2012  . Smokeless tobacco: Never Used  . Alcohol Use: No     occasionally    OB History    Grav Para Term Preterm Abortions TAB SAB Ect Mult Living   10    9 1 8          Review of Systems  Musculoskeletal: Positive for myalgias.  Skin: Positive for wound.  All other systems reviewed and are negative.    Allergies  Codeine and Latex  Home Medications   Current Outpatient Rx  Name Route Sig Dispense Refill  . CALCIUM CARBONATE ANTACID 500 MG PO CHEW Oral Chew 1 tablet by mouth daily.    Marland Kitchen PRENATAL MULTIVITAMIN CH Oral Take 1 tablet by mouth daily.    . OXYCODONE-ACETAMINOPHEN 5-325 MG PO TABS  Oral Take 1 tablet by mouth every 4 (four) hours as needed for pain. 15 tablet 0  . SULFAMETHOXAZOLE-TRIMETHOPRIM 800-160 MG PO TABS Oral Take 1 tablet by mouth every 12 (twelve) hours. 10 tablet 0    BP 136/67  Pulse 85  Temp 98.3 F (36.8 C) (Oral)  Resp 20  SpO2 100%  LMP 03/25/2012  Physical Exam  Nursing note and vitals reviewed. Constitutional: She is oriented to person, place, and time. She appears well-developed and well-nourished. No distress.  HENT:  Head: Normocephalic and atraumatic.  Mouth/Throat: No oropharyngeal exudate.  Eyes: Conjunctivae normal and EOM are normal. No scleral icterus.  Neck: Normal range of motion. Neck supple.  Cardiovascular: Normal rate, regular rhythm and intact distal pulses.  Exam reveals no gallop and no friction rub.   No murmur heard. Pulmonary/Chest: Effort normal and breath sounds normal. She has no wheezes. She has no rales.  Abdominal: Soft. There is no tenderness.  Musculoskeletal: Normal range of motion.  Neurological: She is alert and oriented to person, place, and time. Coordination normal.  Skin: Skin is warm and dry. She is not diaphoretic.       Wound sight tender to palpation, malodorous, and with a small amount of purulent drainage.  Psychiatric: She has a normal mood and affect. Her behavior is normal.    ED Course  Procedures (including critical care time)  Labs Reviewed - No data to display No results found.   1. Wound check, abscess       MDM  3:40 PM Patient's wound packing was removed from the wound. Patient became extremely upset due to pain and refused to have her wound repacked despite my recommendation due to some purulent drainage noted as well as malodorous smell from wound. I informed her of the risks of not having the wound repacked such as worsening of the wound site and possibility of systemic infection. Patient agreed to return with worsening symptoms. Patient has a prescription for Bactrim that she  has not started taking. Her OBGYN has confirmed that it is OK for her to take Bactrim despite her being [redacted] weeks pregnant. She will fill the prescription upon discharge. No further evaluation at this time.         Emilia Beck, PA-C 09/22/12 2302

## 2012-09-11 NOTE — Progress Notes (Signed)
Doing well, but upset she missed her appt with Dr Preston Fleeting. Concerned about taking Septra and Perocet, reassured OK to take. Wound on lower abdomen is packed. Reviewed [place] and lab results. Has followup US scheduled. Unable to retrieve Quad screen result, RN will call her.

## 2012-09-13 LAB — POCT URINALYSIS DIP (DEVICE)
Bilirubin Urine: NEGATIVE
Ketones, ur: NEGATIVE mg/dL
Protein, ur: 30 mg/dL — AB
Specific Gravity, Urine: 1.015 (ref 1.005–1.030)

## 2012-09-16 ENCOUNTER — Ambulatory Visit (HOSPITAL_COMMUNITY)
Admission: RE | Admit: 2012-09-16 | Discharge: 2012-09-16 | Disposition: A | Discharge status: Home / Self Care | Payer: Medicaid Other | Source: Ambulatory Visit | Attending: Physician Assistant | Admitting: Physician Assistant

## 2012-09-16 DIAGNOSIS — Z3480 Encounter for supervision of other normal pregnancy, unspecified trimester: Secondary | ICD-10-CM

## 2012-09-16 DIAGNOSIS — E669 Obesity, unspecified: Secondary | ICD-10-CM | POA: Insufficient documentation

## 2012-09-16 DIAGNOSIS — O2620 Pregnancy care for patient with recurrent pregnancy loss, unspecified trimester: Secondary | ICD-10-CM | POA: Insufficient documentation

## 2012-09-23 NOTE — ED Provider Notes (Signed)
Medical screening examination/treatment/procedure(s) were performed by non-physician practitioner and as supervising physician I was immediately available for consultation/collaboration.  Ibtisam Benge T Ronnel Zuercher, MD 09/23/12 1535 

## 2012-10-09 ENCOUNTER — Ambulatory Visit (INDEPENDENT_AMBULATORY_CARE_PROVIDER_SITE_OTHER): Payer: Medicaid Other | Admitting: Family Medicine

## 2012-10-09 VITALS — BP 128/87 | Temp 97.2°F | Wt 254.4 lb

## 2012-10-09 DIAGNOSIS — O09299 Supervision of pregnancy with other poor reproductive or obstetric history, unspecified trimester: Secondary | ICD-10-CM

## 2012-10-09 DIAGNOSIS — O285 Abnormal chromosomal and genetic finding on antenatal screening of mother: Secondary | ICD-10-CM

## 2012-10-09 DIAGNOSIS — O289 Unspecified abnormal findings on antenatal screening of mother: Secondary | ICD-10-CM

## 2012-10-09 DIAGNOSIS — Z23 Encounter for immunization: Secondary | ICD-10-CM

## 2012-10-09 LAB — POCT URINALYSIS DIP (DEVICE)
Glucose, UA: NEGATIVE mg/dL
Ketones, ur: 40 mg/dL — AB
Specific Gravity, Urine: >=1.03 (ref 1.005–1.030)

## 2012-10-09 LAB — HIV ANTIBODY (ROUTINE TESTING W REFLEX): HIV: NONREACTIVE

## 2012-10-09 LAB — RPR

## 2012-10-09 MED ORDER — TETANUS-DIPHTH-ACELL PERTUSSIS 5-2.5-18.5 LF-MCG/0.5 IM SUSP
0.5000 mL | Freq: Once | INTRAMUSCULAR | Status: AC
  Administered 2012-10-09: 0.5 mL via INTRAMUSCULAR

## 2012-10-09 NOTE — Progress Notes (Signed)
No contractions/bleeding/loss of fluid. Baby moving a lot. Asked about results of genetic screening. AFP elevated 8/21 but looks like sent AFP tumor marker to [facility] instead of actual quad screen. No early screen done. Fetal survey normal. Will send to MFM for genetic counseling but likely is due to wrong lab drawn.

## 2012-10-09 NOTE — Progress Notes (Signed)
P = 108 Pressure in lower abdomen Pain in lower back

## 2012-10-09 NOTE — Patient Instructions (Signed)
Pregnancy - Third Trimester  The third trimester of pregnancy (the last 3 months) is a period of the most rapid growth for you and your baby. The baby approaches a length of 20 inches and a weight of 6 to 10 pounds. The baby is adding on fat and getting ready for life outside your body. While inside, babies have periods of sleeping and waking, suck their thumbs, and hiccups. You can often feel small contractions of the uterus. This is false labor. It is also called Braxton-Hicks contractions. This is like a practice for labor. The usual problems in this stage of pregnancy include more difficulty breathing, swelling of the hands and feet from water retention, and having to urinate more often because of the uterus and baby pressing on your bladder.   PRENATAL EXAMS  · Blood work may continue to be done during prenatal exams. These tests are done to check on your health and the probable health of your baby. Blood work is used to follow your blood levels (hemoglobin). Anemia (low hemoglobin) is common during pregnancy. Iron and vitamins are given to help prevent this. You may also continue to be checked for diabetes. Some of the past blood tests may be done again.  · The size of the uterus is measured during each visit. This makes sure your baby is growing properly according to your pregnancy dates.  · Your blood pressure is checked every prenatal visit. This is to make sure you are not getting toxemia.  · Your urine is checked every prenatal visit for infection, diabetes and protein.  · Your weight is checked at each visit. This is done to make sure gains are happening at the suggested rate and that you and your baby are growing normally.  · Sometimes, an ultrasound is performed to confirm the position and the proper growth and development of the baby. This is a test done that bounces harmless sound waves off the baby so your caregiver can more accurately determine due dates.  · Discuss the type of pain medication and  anesthesia you will have during your labor and delivery.  · Discuss the possibility and anesthesia if a Cesarean Section might be necessary.  · Inform your caregiver if there is any mental or physical violence at home.  Sometimes, a specialized non-stress test, contraction stress test and biophysical profile are done to make sure the baby is not having a problem. Checking the amniotic fluid surrounding the baby is called an amniocentesis. The amniotic fluid is removed by sticking a needle into the belly (abdomen). This is sometimes done near the end of pregnancy if an early delivery is required. In this case, it is done to help make sure the baby's lungs are mature enough for the baby to live outside of the womb. If the lungs are not mature and it is unsafe to deliver the baby, an injection of cortisone medication is given to the mother 1 to 2 days before the delivery. This helps the baby's lungs mature and makes it safer to deliver the baby.  CHANGES OCCURING IN THE THIRD TRIMESTER OF PREGNANCY  Your body goes through many changes during pregnancy. They vary from person to person. Talk to your caregiver about changes you notice and are concerned about.  · During the last trimester, you have probably had an increase in your appetite. It is normal to have cravings for certain foods. This varies from person to person and pregnancy to pregnancy.  · You may begin to   get stretch marks on your hips, abdomen, and breasts. These are normal changes in the body during pregnancy. There are no exercises or medications to take which prevent this change.  · Constipation may be treated with a stool softener or adding bulk to your diet. Drinking lots of fluids, fiber in vegetables, fruits, and whole grains are helpful.  · Exercising is also helpful. If you have been very active up until your pregnancy, most of these activities can be continued during your pregnancy. If you have been less active, it is helpful to start an exercise  program such as walking. Consult your caregiver before starting exercise programs.  · Avoid all smoking, alcohol, un-prescribed drugs, herbs and "street drugs" during your pregnancy. These chemicals affect the formation and growth of the baby. Avoid chemicals throughout the pregnancy to ensure the delivery of a healthy infant.  · Backache, varicose veins and hemorrhoids may develop or get worse.  · You will tire more easily in the third trimester, which is normal.  · The baby's movements may be stronger and more often.  · You may become short of breath easily.  · Your belly button may stick out.  · A yellow discharge may leak from your breasts called colostrum.  · You may have a bloody mucus discharge. This usually occurs a few days to a week before labor begins.  HOME CARE INSTRUCTIONS   · Keep your caregiver's appointments. Follow your caregiver's instructions regarding medication use, exercise, and diet.  · During pregnancy, you are providing food for you and your baby. Continue to eat regular, well-balanced meals. Choose foods such as meat, fish, milk and other low fat dairy products, vegetables, fruits, and whole-grain breads and cereals. Your caregiver will tell you of the ideal weight gain.  · A physical sexual relationship may be continued throughout pregnancy if there are no other problems such as early (premature) leaking of amniotic fluid from the membranes, vaginal bleeding, or belly (abdominal) pain.  · Exercise regularly if there are no restrictions. Check with your caregiver if you are unsure of the safety of your exercises. Greater weight gain will occur in the last 2 trimesters of pregnancy. Exercising helps:  · Control your weight.  · Get you in shape for labor and delivery.  · You lose weight after you deliver.  · Rest a lot with legs elevated, or as needed for leg cramps or low back pain.  · Wear a good support or jogging bra for breast tenderness during pregnancy. This may help if worn during  sleep. Pads or tissues may be used in the bra if you are leaking colostrum.  · Do not use hot tubs, steam rooms, or saunas.  · Wear your seat belt when driving. This protects you and your baby if you are in an accident.  · Avoid raw meat, cat litter boxes and soil used by cats. These carry germs that can cause birth defects in the baby.  · It is easier to loose urine during pregnancy. Tightening up and strengthening the pelvic muscles will help with this problem. You can practice stopping your urination while you are going to the bathroom. These are the same muscles you need to strengthen. It is also the muscles you would use if you were trying to stop from passing gas. You can practice tightening these muscles up 10 times a set and repeating this about 3 times per day. Once you know what muscles to tighten up, do not perform these   exercises during urination. It is more likely to cause an infection by backing up the urine.  · Ask for help if you have financial, counseling or nutritional needs during pregnancy. Your caregiver will be able to offer counseling for these needs as well as refer you for other special needs.  · Make a list of emergency phone numbers and have them available.  · Plan on getting help from family or friends when you go home from the hospital.  · Make a trial run to the hospital.  · Take prenatal classes with the father to understand, practice and ask questions about the labor and delivery.  · Prepare the baby's room/nursery.  · Do not travel out of the city unless it is absolutely necessary and with the advice of your caregiver.  · Wear only low or no heal shoes to have better balance and prevent falling.  MEDICATIONS AND DRUG USE IN PREGNANCY  · Take prenatal vitamins as directed. The vitamin should contain 1 milligram of folic acid. Keep all vitamins out of reach of children. Only a couple vitamins or tablets containing iron may be fatal to a baby or young child when ingested.  · Avoid use  of all medications, including herbs, over-the-counter medications, not prescribed or suggested by your caregiver. Only take over-the-counter or prescription medicines for pain, discomfort, or fever as directed by your caregiver. Do not use aspirin, ibuprofen (Motrin®, Advil®, Nuprin®) or naproxen (Aleve®) unless OK'd by your caregiver.  · Let your caregiver also know about herbs you may be using.  · Alcohol is related to a number of birth defects. This includes fetal alcohol syndrome. All alcohol, in any form, should be avoided completely. Smoking will cause low birth rate and premature babies.  · Street/illegal drugs are very harmful to the baby. They are absolutely forbidden. A baby born to an addicted mother will be addicted at birth. The baby will go through the same withdrawal an adult does.  SEEK MEDICAL CARE IF:  You have any concerns or worries during your pregnancy. It is better to call with your questions if you feel they cannot wait, rather than worry about them.  DECISIONS ABOUT CIRCUMCISION  You may or may not know the sex of your baby. If you know your baby is a boy, it may be time to think about circumcision. Circumcision is the removal of the foreskin of the penis. This is the skin that covers the sensitive end of the penis. There is no proven medical need for this. Often this decision is made on what is popular at the time or based upon religious beliefs and social issues. You can discuss these issues with your caregiver or pediatrician.  SEEK IMMEDIATE MEDICAL CARE IF:   · An unexplained oral temperature above 102° F (38.9° C) develops, or as your caregiver suggests.  · You have leaking of fluid from the vagina (birth canal). If leaking membranes are suspected, take your temperature and tell your caregiver of this when you call.  · There is vaginal spotting, bleeding or passing clots. Tell your caregiver of the amount and how many pads are used.  · You develop a bad smelling vaginal discharge with  a change in the color from clear to white.  · You develop vomiting that lasts more than 24 hours.  · You develop chills or fever.  · You develop shortness of breath.  · You develop burning on urination.  · You loose more than 2 pounds of weight   or gain more than 2 pounds of weight or as suggested by your caregiver.  · You notice sudden swelling of your face, hands, and feet or legs.  · You develop belly (abdominal) pain. Round ligament discomfort is a common non-cancerous (benign) cause of abdominal pain in pregnancy. Your caregiver still must evaluate you.  · You develop a severe headache that does not go away.  · You develop visual problems, blurred or double vision.  · If you have not felt your baby move for more than 1 hour. If you think the baby is not moving as much as usual, eat something with sugar in it and lie down on your left side for an hour. The baby should move at least 4 to 5 times per hour. Call right away if your baby moves less than that.  · You fall, are in a car accident or any kind of trauma.  · There is mental or physical violence at home.  Document Released: 12/05/2001 Document Revised: 03/04/2012 Document Reviewed: 06/09/2009  [employer]® Patient Information ©2013 [employer].

## 2012-10-10 ENCOUNTER — Telehealth: Payer: Self-pay | Admitting: General Practice

## 2012-10-10 LAB — CBC
HCT: 33.1 % — ABNORMAL LOW (ref 36.0–46.0)
Hemoglobin: 11.4 g/dL — ABNORMAL LOW (ref 12.0–15.0)
MCHC: 34.4 g/dL (ref 30.0–36.0)
RBC: 4.02 MIL/uL (ref 3.87–5.11)

## 2012-10-10 LAB — GLUCOSE TOLERANCE, 1 HOUR (50G) W/O FASTING: Glucose, 1 Hour GTT: 108 mg/dL (ref 70–140)

## 2012-10-10 NOTE — Telephone Encounter (Signed)
Patient called stating she had two questions for [place], the first being that she was calling in regards to see if it would be okay to travel out of town to [place] for the weekend and is pregnant. Patient also states she would like the office manager to give her a call because she has a complaint she wants to talk to someone about. Patient left the number [phone] for a call back

## 2012-10-10 NOTE — Telephone Encounter (Signed)
Called patient back and  informed her that I received her message about wanting to go out of town this weekend and asked if she was driving there or flying, patient confirmed that she was driving there and I told her I spoke with a provider and they said it was okay for her to travel out of town this weekend but to get out of the car every 2 hours and walk around for a little while. Patient verbalized understanding. I also informed her that I forwarded her message to the office manager to give her a call back but that she wouldn't be back in the office till Monday. Patient stated that was okay and was very appreciative. Patient stated she received the tdap vaccine yesterday and has been tired since then and wanted to know if this was normal, I informed her that it can sometimes make you tired but that should go away soon. I stated she should give [place] a call back if she has any other questions or problems. Patient verbalized understanding and had no further questions.

## 2012-10-15 NOTE — Telephone Encounter (Signed)
Spoke with patient via phone.  States she is not happy with Dr. Thad Ranger and prefers not to see her again.  I explained to patient I would make a note in her chart and to let me know if she has any further concerns or issues.  Patient states understanding.

## 2012-10-23 ENCOUNTER — Ambulatory Visit (INDEPENDENT_AMBULATORY_CARE_PROVIDER_SITE_OTHER): Payer: Medicaid Other | Admitting: Advanced Practice Midwife

## 2012-10-23 VITALS — BP 144/76 | Temp 99.5°F | Wt 268.1 lb

## 2012-10-23 DIAGNOSIS — L732 Hidradenitis suppurativa: Secondary | ICD-10-CM | POA: Insufficient documentation

## 2012-10-23 DIAGNOSIS — Z3480 Encounter for supervision of other normal pregnancy, unspecified trimester: Secondary | ICD-10-CM

## 2012-10-23 DIAGNOSIS — O09299 Supervision of pregnancy with other poor reproductive or obstetric history, unspecified trimester: Secondary | ICD-10-CM

## 2012-10-23 LAB — POCT URINALYSIS DIP (DEVICE)
Bilirubin Urine: NEGATIVE
Glucose, UA: NEGATIVE mg/dL
Hgb urine dipstick: NEGATIVE
Ketones, ur: NEGATIVE mg/dL
Specific Gravity, Urine: 1.02 (ref 1.005–1.030)
Urobilinogen, UA: 0.2 mg/dL (ref 0.0–1.0)

## 2012-10-23 NOTE — Progress Notes (Signed)
Doing well.  Good fetal movement, denies vaginal bleeding, LOF, regular contractions.  Reports intermittent back pain, self limiting.  Taking Tylenol for this occasionally.  Has lower abdominal abscess currently, has frequent skin abscesses.  Discussed diagnosis of hydradenitis with pt, need to keep skin dry and clean, etc.  Pt denies h/a, epigastric pain, visual disturbances.  BP retake 130s/70s.

## 2012-10-23 NOTE — Progress Notes (Signed)
Pulse- 92  Pain- "cramping"

## 2012-11-06 ENCOUNTER — Ambulatory Visit (INDEPENDENT_AMBULATORY_CARE_PROVIDER_SITE_OTHER): Payer: Medicaid Other | Admitting: Family

## 2012-11-06 VITALS — BP 127/85 | Temp 97.2°F | Wt 272.7 lb

## 2012-11-06 DIAGNOSIS — R8271 Bacteriuria: Secondary | ICD-10-CM

## 2012-11-06 DIAGNOSIS — R82998 Other abnormal findings in urine: Secondary | ICD-10-CM

## 2012-11-06 DIAGNOSIS — N870 Mild cervical dysplasia: Secondary | ICD-10-CM

## 2012-11-06 DIAGNOSIS — Z3480 Encounter for supervision of other normal pregnancy, unspecified trimester: Secondary | ICD-10-CM

## 2012-11-06 DIAGNOSIS — O09299 Supervision of pregnancy with other poor reproductive or obstetric history, unspecified trimester: Secondary | ICD-10-CM

## 2012-11-06 LAB — POCT URINALYSIS DIP (DEVICE)
Bilirubin Urine: NEGATIVE
Glucose, UA: NEGATIVE mg/dL
Hgb urine dipstick: NEGATIVE
Ketones, ur: NEGATIVE mg/dL
Nitrite: NEGATIVE
pH: 7 (ref 5.0–8.0)

## 2012-11-06 MED ORDER — NITROFURANTOIN MONOHYD MACRO 100 MG PO CAPS: 100.0000 mg | ORAL_CAPSULE | Freq: Two times a day (BID) | ORAL | Status: DC

## 2012-11-06 MED ORDER — ONDANSETRON 8 MG PO TBDP: 8.0000 mg | ORAL_TABLET | Freq: Three times a day (TID) | ORAL | Status: DC | PRN

## 2012-11-06 NOTE — Progress Notes (Signed)
No problems or concerns; no PIH symptoms; obtain rubella and antibody screen today. Urine culture to lab; no UTI symptoms.  RX macrobid.

## 2012-11-06 NOTE — Addendum Note (Signed)
Addended by: Franchot Mimes on: 11/06/2012 02:40 PM   Modules accepted: Orders

## 2012-11-06 NOTE — Progress Notes (Signed)
Pulse: 92 Has back pain. Some swelling in her feet.

## 2012-11-07 LAB — ANTIBODY SCREEN: Antibody Screen: NEGATIVE

## 2012-11-08 LAB — CULTURE, OB URINE

## 2012-11-18 ENCOUNTER — Telehealth: Payer: Self-pay | Admitting: Obstetrics and Gynecology

## 2012-11-18 NOTE — Telephone Encounter (Signed)
Patient LM after hours last night c/o lower back pain and pelvic cramps. Called patient back and states she showered, laid down and pain subsided. She is fine now- fetal movement present, no vaginal bleed or discharge, no pain. Advised patient of pre-term labor s/s, precautions and to go to [facility], otherwise we'll see her this Wednesday for her next f/u OB appt. Patient agrees and satisfied.

## 2012-11-20 ENCOUNTER — Encounter: Payer: Self-pay | Admitting: Advanced Practice Midwife

## 2012-11-20 ENCOUNTER — Ambulatory Visit (INDEPENDENT_AMBULATORY_CARE_PROVIDER_SITE_OTHER): Payer: Medicaid Other | Admitting: Advanced Practice Midwife

## 2012-11-20 VITALS — BP 125/85 | Temp 97.0°F | Wt 261.2 lb

## 2012-11-20 DIAGNOSIS — N939 Abnormal uterine and vaginal bleeding, unspecified: Secondary | ICD-10-CM

## 2012-11-20 DIAGNOSIS — N898 Other specified noninflammatory disorders of vagina: Secondary | ICD-10-CM

## 2012-11-20 DIAGNOSIS — O09299 Supervision of pregnancy with other poor reproductive or obstetric history, unspecified trimester: Secondary | ICD-10-CM

## 2012-11-20 LAB — POCT URINALYSIS DIP (DEVICE)
Hgb urine dipstick: NEGATIVE
Nitrite: NEGATIVE
Protein, ur: 100 mg/dL — AB
Specific Gravity, Urine: 1.02 (ref 1.005–1.030)
Urobilinogen, UA: 2 mg/dL — ABNORMAL HIGH (ref 0.0–1.0)

## 2012-11-20 MED ORDER — ONDANSETRON 8 MG PO TBDP: 8.0000 mg | ORAL_TABLET | Freq: Three times a day (TID) | ORAL | Status: DC | PRN

## 2012-11-20 NOTE — Progress Notes (Signed)
P = 82 Pain in lower back and lower abdomen Trace edema in LE

## 2012-11-20 NOTE — Progress Notes (Signed)
Some pain in lower back and abdomen, no contractions. Denies discharge or bleeding. Feeling well but needs a refill on her Zofran to control her nausea. Discussed PTL signs and fetal kick count. F/u in 2 weeks.

## 2012-11-20 NOTE — Progress Notes (Signed)
I saw and examined patient along with student and agree with above note.   FRAZIER,NATALIE 11/20/2012 12:25 PM

## 2012-11-20 NOTE — Patient Instructions (Signed)
Pregnancy - Third Trimester  The third trimester of pregnancy (the last 3 months) is a period of the most rapid growth for you and your baby. The baby approaches a length of 20 inches and a weight of 6 to 10 pounds. The baby is adding on fat and getting ready for life outside your body. While inside, babies have periods of sleeping and waking, suck their thumbs, and hiccups. You can often feel small contractions of the uterus. This is false labor. It is also called Braxton-Hicks contractions. This is like a practice for labor. The usual problems in this stage of pregnancy include more difficulty breathing, swelling of the hands and feet from water retention, and having to urinate more often because of the uterus and baby pressing on your bladder.   PRENATAL EXAMS  · Blood work may continue to be done during prenatal exams. These tests are done to check on your health and the probable health of your baby. Blood work is used to follow your blood levels (hemoglobin). Anemia (low hemoglobin) is common during pregnancy. Iron and vitamins are given to help prevent this. You may also continue to be checked for diabetes. Some of the past blood tests may be done again.  · The size of the uterus is measured during each visit. This makes sure your baby is growing properly according to your pregnancy dates.  · Your blood pressure is checked every prenatal visit. This is to make sure you are not getting toxemia.  · Your urine is checked every prenatal visit for infection, diabetes and protein.  · Your weight is checked at each visit. This is done to make sure gains are happening at the suggested rate and that you and your baby are growing normally.  · Sometimes, an ultrasound is performed to confirm the position and the proper growth and development of the baby. This is a test done that bounces harmless sound waves off the baby so your caregiver can more accurately determine due dates.  · Discuss the type of pain medication and  anesthesia you will have during your labor and delivery.  · Discuss the possibility and anesthesia if a Cesarean Section might be necessary.  · Inform your caregiver if there is any mental or physical violence at home.  Sometimes, a specialized non-stress test, contraction stress test and biophysical profile are done to make sure the baby is not having a problem. Checking the amniotic fluid surrounding the baby is called an amniocentesis. The amniotic fluid is removed by sticking a needle into the belly (abdomen). This is sometimes done near the end of pregnancy if an early delivery is required. In this case, it is done to help make sure the baby's lungs are mature enough for the baby to live outside of the womb. If the lungs are not mature and it is unsafe to deliver the baby, an injection of cortisone medication is given to the mother 1 to 2 days before the delivery. This helps the baby's lungs mature and makes it safer to deliver the baby.  CHANGES OCCURING IN THE THIRD TRIMESTER OF PREGNANCY  Your body goes through many changes during pregnancy. They vary from person to person. Talk to your caregiver about changes you notice and are concerned about.  · During the last trimester, you have probably had an increase in your appetite. It is normal to have cravings for certain foods. This varies from person to person and pregnancy to pregnancy.  · You may begin to   get stretch marks on your hips, abdomen, and breasts. These are normal changes in the body during pregnancy. There are no exercises or medications to take which prevent this change.  · Constipation may be treated with a stool softener or adding bulk to your diet. Drinking lots of fluids, fiber in vegetables, fruits, and whole grains are helpful.  · Exercising is also helpful. If you have been very active up until your pregnancy, most of these activities can be continued during your pregnancy. If you have been less active, it is helpful to start an exercise  program such as walking. Consult your caregiver before starting exercise programs.  · Avoid all smoking, alcohol, un-prescribed drugs, herbs and "street drugs" during your pregnancy. These chemicals affect the formation and growth of the baby. Avoid chemicals throughout the pregnancy to ensure the delivery of a healthy infant.  · Backache, varicose veins and hemorrhoids may develop or get worse.  · You will tire more easily in the third trimester, which is normal.  · The baby's movements may be stronger and more often.  · You may become short of breath easily.  · Your belly button may stick out.  · A yellow discharge may leak from your breasts called colostrum.  · You may have a bloody mucus discharge. This usually occurs a few days to a week before labor begins.  HOME CARE INSTRUCTIONS   · Keep your caregiver's appointments. Follow your caregiver's instructions regarding medication use, exercise, and diet.  · During pregnancy, you are providing food for you and your baby. Continue to eat regular, well-balanced meals. Choose foods such as meat, fish, milk and other low fat dairy products, vegetables, fruits, and whole-grain breads and cereals. Your caregiver will tell you of the ideal weight gain.  · A physical sexual relationship may be continued throughout pregnancy if there are no other problems such as early (premature) leaking of amniotic fluid from the membranes, vaginal bleeding, or belly (abdominal) pain.  · Exercise regularly if there are no restrictions. Check with your caregiver if you are unsure of the safety of your exercises. Greater weight gain will occur in the last 2 trimesters of pregnancy. Exercising helps:  · Control your weight.  · Get you in shape for labor and delivery.  · You lose weight after you deliver.  · Rest a lot with legs elevated, or as needed for leg cramps or low back pain.  · Wear a good support or jogging bra for breast tenderness during pregnancy. This may help if worn during  sleep. Pads or tissues may be used in the bra if you are leaking colostrum.  · Do not use hot tubs, steam rooms, or saunas.  · Wear your seat belt when driving. This protects you and your baby if you are in an accident.  · Avoid raw meat, cat litter boxes and soil used by cats. These carry germs that can cause birth defects in the baby.  · It is easier to loose urine during pregnancy. Tightening up and strengthening the pelvic muscles will help with this problem. You can practice stopping your urination while you are going to the bathroom. These are the same muscles you need to strengthen. It is also the muscles you would use if you were trying to stop from passing gas. You can practice tightening these muscles up 10 times a set and repeating this about 3 times per day. Once you know what muscles to tighten up, do not perform these   exercises during urination. It is more likely to cause an infection by backing up the urine.  · Ask for help if you have financial, counseling or nutritional needs during pregnancy. Your caregiver will be able to offer counseling for these needs as well as refer you for other special needs.  · Make a list of emergency phone numbers and have them available.  · Plan on getting help from family or friends when you go home from the hospital.  · Make a trial run to the hospital.  · Take prenatal classes with the father to understand, practice and ask questions about the labor and delivery.  · Prepare the baby's room/nursery.  · Do not travel out of the city unless it is absolutely necessary and with the advice of your caregiver.  · Wear only low or no heal shoes to have better balance and prevent falling.  MEDICATIONS AND DRUG USE IN PREGNANCY  · Take prenatal vitamins as directed. The vitamin should contain 1 milligram of folic acid. Keep all vitamins out of reach of children. Only a couple vitamins or tablets containing iron may be fatal to a baby or young child when ingested.  · Avoid use  of all medications, including herbs, over-the-counter medications, not prescribed or suggested by your caregiver. Only take over-the-counter or prescription medicines for pain, discomfort, or fever as directed by your caregiver. Do not use aspirin, ibuprofen (Motrin®, Advil®, Nuprin®) or naproxen (Aleve®) unless OK'd by your caregiver.  · Let your caregiver also know about herbs you may be using.  · Alcohol is related to a number of birth defects. This includes fetal alcohol syndrome. All alcohol, in any form, should be avoided completely. Smoking will cause low birth rate and premature babies.  · Street/illegal drugs are very harmful to the baby. They are absolutely forbidden. A baby born to an addicted mother will be addicted at birth. The baby will go through the same withdrawal an adult does.  SEEK MEDICAL CARE IF:  You have any concerns or worries during your pregnancy. It is better to call with your questions if you feel they cannot wait, rather than worry about them.  DECISIONS ABOUT CIRCUMCISION  You may or may not know the sex of your baby. If you know your baby is a boy, it may be time to think about circumcision. Circumcision is the removal of the foreskin of the penis. This is the skin that covers the sensitive end of the penis. There is no proven medical need for this. Often this decision is made on what is popular at the time or based upon religious beliefs and social issues. You can discuss these issues with your caregiver or pediatrician.  SEEK IMMEDIATE MEDICAL CARE IF:   · An unexplained oral temperature above 102° F (38.9° C) develops, or as your caregiver suggests.  · You have leaking of fluid from the vagina (birth canal). If leaking membranes are suspected, take your temperature and tell your caregiver of this when you call.  · There is vaginal spotting, bleeding or passing clots. Tell your caregiver of the amount and how many pads are used.  · You develop a bad smelling vaginal discharge with  a change in the color from clear to white.  · You develop vomiting that lasts more than 24 hours.  · You develop chills or fever.  · You develop shortness of breath.  · You develop burning on urination.  · You loose more than 2 pounds of weight   or gain more than 2 pounds of weight or as suggested by your caregiver.  · You notice sudden swelling of your face, hands, and feet or legs.  · You develop belly (abdominal) pain. Round ligament discomfort is a common non-cancerous (benign) cause of abdominal pain in pregnancy. Your caregiver still must evaluate you.  · You develop a severe headache that does not go away.  · You develop visual problems, blurred or double vision.  · If you have not felt your baby move for more than 1 hour. If you think the baby is not moving as much as usual, eat something with sugar in it and lie down on your left side for an hour. The baby should move at least 4 to 5 times per hour. Call right away if your baby moves less than that.  · You fall, are in a car accident or any kind of trauma.  · There is mental or physical violence at home.  Document Released: 12/05/2001 Document Revised: 03/04/2012 Document Reviewed: 06/09/2009  [employer]® Patient Information ©2013 [employer].

## 2012-12-04 ENCOUNTER — Encounter: Payer: Self-pay | Admitting: Advanced Practice Midwife

## 2012-12-04 ENCOUNTER — Ambulatory Visit (INDEPENDENT_AMBULATORY_CARE_PROVIDER_SITE_OTHER): Payer: Medicaid Other | Admitting: Advanced Practice Midwife

## 2012-12-04 ENCOUNTER — Other Ambulatory Visit (HOSPITAL_COMMUNITY)
Admission: RE | Admit: 2012-12-04 | Discharge: 2012-12-04 | Disposition: A | Discharge status: Home / Self Care | Payer: Medicaid Other | Source: Ambulatory Visit | Attending: Advanced Practice Midwife | Admitting: Advanced Practice Midwife

## 2012-12-04 VITALS — BP 107/70 | Temp 97.3°F | Wt 264.0 lb

## 2012-12-04 DIAGNOSIS — Z113 Encounter for screening for infections with a predominantly sexual mode of transmission: Secondary | ICD-10-CM | POA: Insufficient documentation

## 2012-12-04 DIAGNOSIS — O09299 Supervision of pregnancy with other poor reproductive or obstetric history, unspecified trimester: Secondary | ICD-10-CM

## 2012-12-04 DIAGNOSIS — Z3480 Encounter for supervision of other normal pregnancy, unspecified trimester: Secondary | ICD-10-CM

## 2012-12-04 LAB — POCT URINALYSIS DIP (DEVICE)
Glucose, UA: NEGATIVE mg/dL
Nitrite: NEGATIVE
Urobilinogen, UA: 4 mg/dL — ABNORMAL HIGH (ref 0.0–1.0)
pH: 6 (ref 5.0–8.0)

## 2012-12-04 LAB — OB RESULTS CONSOLE GBS: GBS: NEGATIVE

## 2012-12-04 NOTE — Progress Notes (Signed)
Pulse  85 C/o intermittent back and pelvic pain; pressure in pelvic.

## 2012-12-04 NOTE — Progress Notes (Signed)
Infrequent contractions. Denies vaginal bleeding, LOF. Good fetal movement. Discussed labor signs and fetal kick count. F/U in 1 week. GBS and GC culture collected today.

## 2012-12-04 NOTE — Progress Notes (Signed)
Doing well. Cultures and GBS done.

## 2012-12-04 NOTE — Patient Instructions (Signed)
Normal Labor and Delivery  Your caregiver must first be sure you are in labor. Signs of labor include:  · You may pass what is called "the mucus plug" before labor begins. This is a small amount of blood stained mucus.  · Regular uterine contractions.  · The time between contractions get closer together.  · The discomfort and pain gradually gets more intense.  · Pains are mostly located in the back.  · Pains get worse when walking.  · The cervix (the opening of the uterus becomes thinner (begins to efface) and opens up (dilates).  Once you are in labor and admitted into the hospital or care center, your caregiver will do the following:  · A complete physical examination.  · Check your vital signs (blood pressure, pulse, temperature and the fetal heart rate).  · Do a vaginal examination (using a sterile glove and lubricant) to determine:  · The position (presentation) of the baby (head [vertex] or buttock first).  · The level (station) of the baby's head in the birth canal.  · The effacement and dilatation of the cervix.  · You may have your pubic hair shaved and be given an enema depending on your caregiver and the circumstance.  · An electronic monitor is usually placed on your abdomen. The monitor follows the length and intensity of the contractions, as well as the baby's heart rate.  · Usually, your caregiver will insert an IV in your arm with a bottle of sugar water. This is done as a precaution so that medications can be given to you quickly during labor or delivery.  NORMAL LABOR AND DELIVERY IS DIVIDED UP INTO 3 STAGES:  First Stage  This is when regular contractions begin and the cervix begins to efface and dilate. This stage can last from 3 to 15 hours. The end of the first stage is when the cervix is 100% effaced and 10 centimeters dilated. Pain medications may be given by   · Injection (morphine, demerol, etc.)  · Regional anesthesia (spinal, caudal or epidural, anesthetics given in different locations of  the spine). Paracervical pain medication may be given, which is an injection of and anesthetic on each side of the cervix.  A pregnant woman may request to have "Natural Childbirth" which is not to have any medications or anesthesia during her labor and delivery.  Second Stage  This is when the baby comes down through the birth canal (vagina) and is born. This can take 1 to 4 hours. As the baby's head comes down through the birth canal, you may feel like you are going to have a bowel movement. You will get the urge to bear down and push until the baby is delivered. As the baby's head is being delivered, the caregiver will decide if an episiotomy (a cut in the perineum and vagina area) is needed to prevent tearing of the tissue in this area. The episiotomy is sewn up after the delivery of the baby and placenta. Sometimes a mask with nitrous oxide is given for the mother to breath during the delivery of the baby to help if there is too much pain. The end of Stage 2 is when the baby is fully delivered. Then when the umbilical cord stops pulsating it is clamped and cut.  Third Stage  The third stage begins after the baby is completely delivered and ends after the placenta (afterbirth) is delivered. This usually takes 5 to 30 minutes. After the placenta is delivered, a medication   is given either by intravenous or injection to help contract the uterus and prevent bleeding. The third stage is not painful and pain medication is usually not necessary. If an episiotomy was done, it is repaired at this time.  After the delivery, the mother is watched and monitored closely for 1 to 2 hours to make sure there is no postpartum bleeding (hemorrhage). If there is a lot of bleeding, medication is given to contract the uterus and stop the bleeding.  Document Released: 09/19/2008 Document Revised: 03/04/2012 Document Reviewed: 09/19/2008  Exit[employer]® Patient Information ©2013 [employer].

## 2012-12-11 ENCOUNTER — Encounter: Payer: Self-pay | Admitting: Advanced Practice Midwife

## 2012-12-11 ENCOUNTER — Ambulatory Visit (INDEPENDENT_AMBULATORY_CARE_PROVIDER_SITE_OTHER): Payer: Medicaid Other | Admitting: Advanced Practice Midwife

## 2012-12-11 VITALS — BP 110/68 | Temp 96.7°F | Wt 261.3 lb

## 2012-12-11 DIAGNOSIS — O09299 Supervision of pregnancy with other poor reproductive or obstetric history, unspecified trimester: Secondary | ICD-10-CM

## 2012-12-11 DIAGNOSIS — Z3490 Encounter for supervision of normal pregnancy, unspecified, unspecified trimester: Secondary | ICD-10-CM

## 2012-12-11 LAB — POCT URINALYSIS DIP (DEVICE)
Bilirubin Urine: NEGATIVE
Glucose, UA: NEGATIVE mg/dL
Ketones, ur: NEGATIVE mg/dL
Nitrite: NEGATIVE
Protein, ur: 30 mg/dL — AB
Specific Gravity, Urine: 1.015 (ref 1.005–1.030)
Urobilinogen, UA: 1 mg/dL (ref 0.0–1.0)
pH: 7 (ref 5.0–8.0)

## 2012-12-11 NOTE — Progress Notes (Signed)
Pulse 116 Patient reports pelvic pain and occasional contractions

## 2012-12-11 NOTE — Progress Notes (Signed)
GBS and cultures negative. Feels well. Some painful contractions.  Reviewed signs of labor

## 2012-12-11 NOTE — Patient Instructions (Addendum)
Normal Labor and Delivery  Your caregiver must first be sure you are in labor. Signs of labor include:  · You may pass what is called "the mucus plug" before labor begins. This is a small amount of blood stained mucus.  · Regular uterine contractions.  · The time between contractions get closer together.  · The discomfort and pain gradually gets more intense.  · Pains are mostly located in the back.  · Pains get worse when walking.  · The cervix (the opening of the uterus becomes thinner (begins to efface) and opens up (dilates).  Once you are in labor and admitted into the hospital or care center, your caregiver will do the following:  · A complete physical examination.  · Check your vital signs (blood pressure, pulse, temperature and the fetal heart rate).  · Do a vaginal examination (using a sterile glove and lubricant) to determine:  · The position (presentation) of the baby (head [vertex] or buttock first).  · The level (station) of the baby's head in the birth canal.  · The effacement and dilatation of the cervix.  · You may have your pubic hair shaved and be given an enema depending on your caregiver and the circumstance.  · An electronic monitor is usually placed on your abdomen. The monitor follows the length and intensity of the contractions, as well as the baby's heart rate.  · Usually, your caregiver will insert an IV in your arm with a bottle of sugar water. This is done as a precaution so that medications can be given to you quickly during labor or delivery.  NORMAL LABOR AND DELIVERY IS DIVIDED UP INTO 3 STAGES:  First Stage  This is when regular contractions begin and the cervix begins to efface and dilate. This stage can last from 3 to 15 hours. The end of the first stage is when the cervix is 100% effaced and 10 centimeters dilated. Pain medications may be given by   · Injection (morphine, demerol, etc.)  · Regional anesthesia (spinal, caudal or epidural, anesthetics given in different locations of  the spine). Paracervical pain medication may be given, which is an injection of and anesthetic on each side of the cervix.  A pregnant woman may request to have "Natural Childbirth" which is not to have any medications or anesthesia during her labor and delivery.  Second Stage  This is when the baby comes down through the birth canal (vagina) and is born. This can take 1 to 4 hours. As the baby's head comes down through the birth canal, you may feel like you are going to have a bowel movement. You will get the urge to bear down and push until the baby is delivered. As the baby's head is being delivered, the caregiver will decide if an episiotomy (a cut in the perineum and vagina area) is needed to prevent tearing of the tissue in this area. The episiotomy is sewn up after the delivery of the baby and placenta. Sometimes a mask with nitrous oxide is given for the mother to breath during the delivery of the baby to help if there is too much pain. The end of Stage 2 is when the baby is fully delivered. Then when the umbilical cord stops pulsating it is clamped and cut.  Third Stage  The third stage begins after the baby is completely delivered and ends after the placenta (afterbirth) is delivered. This usually takes 5 to 30 minutes. After the placenta is delivered, a medication   is given either by intravenous or injection to help contract the uterus and prevent bleeding. The third stage is not painful and pain medication is usually not necessary. If an episiotomy was done, it is repaired at this time.  After the delivery, the mother is watched and monitored closely for 1 to 2 hours to make sure there is no postpartum bleeding (hemorrhage). If there is a lot of bleeding, medication is given to contract the uterus and stop the bleeding.  Document Released: 09/19/2008 Document Revised: 03/04/2012 Document Reviewed: 09/19/2008  [employer]® Patient Information ©2013 [employer].

## 2012-12-23 ENCOUNTER — Ambulatory Visit (INDEPENDENT_AMBULATORY_CARE_PROVIDER_SITE_OTHER): Payer: Medicaid Other | Admitting: Obstetrics and Gynecology

## 2012-12-23 VITALS — BP 141/83 | Temp 96.6°F | Wt 270.9 lb

## 2012-12-23 DIAGNOSIS — Z3490 Encounter for supervision of normal pregnancy, unspecified, unspecified trimester: Secondary | ICD-10-CM

## 2012-12-23 DIAGNOSIS — O09299 Supervision of pregnancy with other poor reproductive or obstetric history, unspecified trimester: Secondary | ICD-10-CM

## 2012-12-23 LAB — POCT URINALYSIS DIP (DEVICE)
Nitrite: NEGATIVE
Protein, ur: NEGATIVE mg/dL
Urobilinogen, UA: 0.2 mg/dL (ref 0.0–1.0)
pH: 7.5 (ref 5.0–8.0)

## 2012-12-23 NOTE — Patient Instructions (Addendum)
Pregnancy - Third Trimester  The third trimester of pregnancy (the last 3 months) is a period of the most rapid growth for you and your baby. The baby approaches a length of 20 inches and a weight of 6 to 10 pounds. The baby is adding on fat and getting ready for life outside your body. While inside, babies have periods of sleeping and waking, suck their thumbs, and hiccups. You can often feel small contractions of the uterus. This is false labor. It is also called Braxton-Hicks contractions. This is like a practice for labor. The usual problems in this stage of pregnancy include more difficulty breathing, swelling of the hands and feet from water retention, and having to urinate more often because of the uterus and baby pressing on your bladder.   PRENATAL EXAMS  · Blood work may continue to be done during prenatal exams. These tests are done to check on your health and the probable health of your baby. Blood work is used to follow your blood levels (hemoglobin). Anemia (low hemoglobin) is common during pregnancy. Iron and vitamins are given to help prevent this. You may also continue to be checked for diabetes. Some of the past blood tests may be done again.  · The size of the uterus is measured during each visit. This makes sure your baby is growing properly according to your pregnancy dates.  · Your blood pressure is checked every prenatal visit. This is to make sure you are not getting toxemia.  · Your urine is checked every prenatal visit for infection, diabetes and protein.  · Your weight is checked at each visit. This is done to make sure gains are happening at the suggested rate and that you and your baby are growing normally.  · Sometimes, an ultrasound is performed to confirm the position and the proper growth and development of the baby. This is a test done that bounces harmless sound waves off the baby so your caregiver can more accurately determine due dates.  · Discuss the type of pain medication and  anesthesia you will have during your labor and delivery.  · Discuss the possibility and anesthesia if a Cesarean Section might be necessary.  · Inform your caregiver if there is any mental or physical violence at home.  Sometimes, a specialized non-stress test, contraction stress test and biophysical profile are done to make sure the baby is not having a problem. Checking the amniotic fluid surrounding the baby is called an amniocentesis. The amniotic fluid is removed by sticking a needle into the belly (abdomen). This is sometimes done near the end of pregnancy if an early delivery is required. In this case, it is done to help make sure the baby's lungs are mature enough for the baby to live outside of the womb. If the lungs are not mature and it is unsafe to deliver the baby, an injection of cortisone medication is given to the mother 1 to 2 days before the delivery. This helps the baby's lungs mature and makes it safer to deliver the baby.  CHANGES OCCURING IN THE THIRD TRIMESTER OF PREGNANCY  Your body goes through many changes during pregnancy. They vary from person to person. Talk to your caregiver about changes you notice and are concerned about.  · During the last trimester, you have probably had an increase in your appetite. It is normal to have cravings for certain foods. This varies from person to person and pregnancy to pregnancy.  · You may begin to   get stretch marks on your hips, abdomen, and breasts. These are normal changes in the body during pregnancy. There are no exercises or medications to take which prevent this change.  · Constipation may be treated with a stool softener or adding bulk to your diet. Drinking lots of fluids, fiber in vegetables, fruits, and whole grains are helpful.  · Exercising is also helpful. If you have been very active up until your pregnancy, most of these activities can be continued during your pregnancy. If you have been less active, it is helpful to start an exercise  program such as walking. Consult your caregiver before starting exercise programs.  · Avoid all smoking, alcohol, un-prescribed drugs, herbs and "street drugs" during your pregnancy. These chemicals affect the formation and growth of the baby. Avoid chemicals throughout the pregnancy to ensure the delivery of a healthy infant.  · Backache, varicose veins and hemorrhoids may develop or get worse.  · You will tire more easily in the third trimester, which is normal.  · The baby's movements may be stronger and more often.  · You may become short of breath easily.  · Your belly button may stick out.  · A yellow discharge may leak from your breasts called colostrum.  · You may have a bloody mucus discharge. This usually occurs a few days to a week before labor begins.  HOME CARE INSTRUCTIONS   · Keep your caregiver's appointments. Follow your caregiver's instructions regarding medication use, exercise, and diet.  · During pregnancy, you are providing food for you and your baby. Continue to eat regular, well-balanced meals. Choose foods such as meat, fish, milk and other low fat dairy products, vegetables, fruits, and whole-grain breads and cereals. Your caregiver will tell you of the ideal weight gain.  · A physical sexual relationship may be continued throughout pregnancy if there are no other problems such as early (premature) leaking of amniotic fluid from the membranes, vaginal bleeding, or belly (abdominal) pain.  · Exercise regularly if there are no restrictions. Check with your caregiver if you are unsure of the safety of your exercises. Greater weight gain will occur in the last 2 trimesters of pregnancy. Exercising helps:  · Control your weight.  · Get you in shape for labor and delivery.  · You lose weight after you deliver.  · Rest a lot with legs elevated, or as needed for leg cramps or low back pain.  · Wear a good support or jogging bra for breast tenderness during pregnancy. This may help if worn during  sleep. Pads or tissues may be used in the bra if you are leaking colostrum.  · Do not use hot tubs, steam rooms, or saunas.  · Wear your seat belt when driving. This protects you and your baby if you are in an accident.  · Avoid raw meat, cat litter boxes and soil used by cats. These carry germs that can cause birth defects in the baby.  · It is easier to loose urine during pregnancy. Tightening up and strengthening the pelvic muscles will help with this problem. You can practice stopping your urination while you are going to the bathroom. These are the same muscles you need to strengthen. It is also the muscles you would use if you were trying to stop from passing gas. You can practice tightening these muscles up 10 times a set and repeating this about 3 times per day. Once you know what muscles to tighten up, do not perform these   exercises during urination. It is more likely to cause an infection by backing up the urine.  · Ask for help if you have financial, counseling or nutritional needs during pregnancy. Your caregiver will be able to offer counseling for these needs as well as refer you for other special needs.  · Make a list of emergency phone numbers and have them available.  · Plan on getting help from family or friends when you go home from the hospital.  · Make a trial run to the hospital.  · Take prenatal classes with the father to understand, practice and ask questions about the labor and delivery.  · Prepare the baby's room/nursery.  · Do not travel out of the city unless it is absolutely necessary and with the advice of your caregiver.  · Wear only low or no heal shoes to have better balance and prevent falling.  MEDICATIONS AND DRUG USE IN PREGNANCY  · Take prenatal vitamins as directed. The vitamin should contain 1 milligram of folic acid. Keep all vitamins out of reach of children. Only a couple vitamins or tablets containing iron may be fatal to a baby or young child when ingested.  · Avoid use  of all medications, including herbs, over-the-counter medications, not prescribed or suggested by your caregiver. Only take over-the-counter or prescription medicines for pain, discomfort, or fever as directed by your caregiver. Do not use aspirin, ibuprofen (Motrin®, Advil®, Nuprin®) or naproxen (Aleve®) unless OK'd by your caregiver.  · Let your caregiver also know about herbs you may be using.  · Alcohol is related to a number of birth defects. This includes fetal alcohol syndrome. All alcohol, in any form, should be avoided completely. Smoking will cause low birth rate and premature babies.  · Street/illegal drugs are very harmful to the baby. They are absolutely forbidden. A baby born to an addicted mother will be addicted at birth. The baby will go through the same withdrawal an adult does.  SEEK MEDICAL CARE IF:  You have any concerns or worries during your pregnancy. It is better to call with your questions if you feel they cannot wait, rather than worry about them.  DECISIONS ABOUT CIRCUMCISION  You may or may not know the sex of your baby. If you know your baby is a boy, it may be time to think about circumcision. Circumcision is the removal of the foreskin of the penis. This is the skin that covers the sensitive end of the penis. There is no proven medical need for this. Often this decision is made on what is popular at the time or based upon religious beliefs and social issues. You can discuss these issues with your caregiver or pediatrician.  SEEK IMMEDIATE MEDICAL CARE IF:   · An unexplained oral temperature above 102° F (38.9° C) develops, or as your caregiver suggests.  · You have leaking of fluid from the vagina (birth canal). If leaking membranes are suspected, take your temperature and tell your caregiver of this when you call.  · There is vaginal spotting, bleeding or passing clots. Tell your caregiver of the amount and how many pads are used.  · You develop a bad smelling vaginal discharge with  a change in the color from clear to white.  · You develop vomiting that lasts more than 24 hours.  · You develop chills or fever.  · You develop shortness of breath.  · You develop burning on urination.  · You loose more than 2 pounds of weight   or gain more than 2 pounds of weight or as suggested by your caregiver.  · You notice sudden swelling of your face, hands, and feet or legs.  · You develop belly (abdominal) pain. Round ligament discomfort is a common non-cancerous (benign) cause of abdominal pain in pregnancy. Your caregiver still must evaluate you.  · You develop a severe headache that does not go away.  · You develop visual problems, blurred or double vision.  · If you have not felt your baby move for more than 1 hour. If you think the baby is not moving as much as usual, eat something with sugar in it and lie down on your left side for an hour. The baby should move at least 4 to 5 times per hour. Call right away if your baby moves less than that.  · You fall, are in a car accident or any kind of trauma.  · There is mental or physical violence at home.  Document Released: 12/05/2001 Document Revised: 03/04/2012 Document Reviewed: 06/09/2009  [employer]® Patient Information ©2013 [employer].

## 2012-12-23 NOTE — Progress Notes (Signed)
Pulse- 88  Edema- feet

## 2012-12-23 NOTE — Progress Notes (Signed)
Doing well. More LBP and requests cx check. No H/A visual sx, epigastric pain. BP recheck: WNL

## 2012-12-23 NOTE — Progress Notes (Signed)
11:26am 12/23/2012 BP= 125/71 P= 87

## 2012-12-25 NOTE — L&D Delivery Note (Signed)
I was present for this delivery and agree with the above resident's note.    Sharen Counter Certified Nurse-Midwife

## 2012-12-25 NOTE — L&D Delivery Note (Signed)
Delivery Note At 6:24 AM a viable female was delivered via Vaginal, Spontaneous Delivery (Presentation: Right Occiput Anterior).  APGAR: 8,8 ; weight 6 lb 6.3 oz (2900 g).   Placenta status: Intact, Spontaneous.  Cord: 3 vessels with the following complications: None.    Anesthesia: Epidural  Episiotomy: none Lacerations: intact Suture Repair: NA Est. Blood Loss (mL): 300  Mom to postpartum.  Baby to nursery-stable.  Bonnita Nasuti 01/03/2013, 6:46 AM

## 2012-12-30 ENCOUNTER — Encounter (HOSPITAL_COMMUNITY): Payer: Self-pay | Admitting: *Deleted

## 2012-12-30 ENCOUNTER — Inpatient Hospital Stay (HOSPITAL_COMMUNITY)
Admission: AD | Admit: 2012-12-30 | Discharge: 2012-12-30 | Disposition: A | Discharge status: Home / Self Care | Payer: Medicaid Other | Source: Ambulatory Visit | Attending: Obstetrics & Gynecology | Admitting: Obstetrics & Gynecology

## 2012-12-30 DIAGNOSIS — O368190 Decreased fetal movements, unspecified trimester, not applicable or unspecified: Secondary | ICD-10-CM | POA: Insufficient documentation

## 2012-12-30 DIAGNOSIS — Z3480 Encounter for supervision of other normal pregnancy, unspecified trimester: Secondary | ICD-10-CM

## 2012-12-30 DIAGNOSIS — N870 Mild cervical dysplasia: Secondary | ICD-10-CM

## 2012-12-30 NOTE — MAU Note (Signed)
Pt reports no fetal movement x 5 hours.

## 2012-12-30 NOTE — MAU Provider Note (Signed)
History     CSN: [id number]  Arrival date and time: 12/30/12 2030   None     Chief Complaint  Patient presents with  . Decreased Fetal Movement   HPI 32 y.o. G10P0090 at [redacted]w[redacted]d with decreased fetal movement after 4 PM today. Moving normally all day. At 7:30 realized hadn't felt baby move for several hours. Tried to drink juice, etc. As soon as she got to MAU on the monitor, baby moving well. No ctx now- had ctx all day Sat and Sunday but resolved now. No bleeding, loss of fluid.   Last prenatal visit on Monday 12/30 - [facility]. Has appt 1/8. Hx of 9 SABs. Has had BP in 140s/80s.  OB History    Grav Para Term Preterm Abortions TAB SAB Ect Mult Living   10    9 1 8          Past Medical History  Diagnosis Date  . Anemia   . 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 Dilation and curettage of uterus   . Colposcopy 12/2011    Family History  Problem Relation Age of Onset  . Heart disease Mother     had heart attack with stents placed  . Hypertension Mother   . Deep vein thrombosis Mother   . Hypertension Father   . Diabetes Father   . Asthma Brother     History  Substance Use Topics  . Smoking status: Former Smoker -- 0.3 packs/day    Types: Cigarettes    Quit date: 05/20/2012  . Smokeless tobacco: Never Used  . Alcohol Use: No     Comment: occasionally    Allergies:  Allergies  Allergen Reactions  . Codeine Hives  . Latex Hives    Prescriptions prior to admission  Medication Sig Dispense Refill  . acetaminophen (TYLENOL) 325 MG tablet Take 650 mg by mouth every 6 (six) hours as needed. Lower back pain      . calcium carbonate (TUMS - DOSED IN MG ELEMENTAL CALCIUM) 500 MG chewable tablet Chew 1 tablet by mouth daily.      . ondansetron (ZOFRAN ODT) 8 MG disintegrating tablet Take 1 tablet (8 mg total) by mouth every 8 (eight) hours as needed for nausea.  20 tablet  1  . Prenatal Vit-Fe Fumarate-FA (PRENATAL  MULTIVITAMIN) TABS Take 1 tablet by mouth daily.        Review of Systems  Constitutional: Negative for fever and chills.  Eyes: Negative for blurred vision and double vision.  Gastrointestinal: Positive for nausea. Negative for vomiting, abdominal pain, diarrhea and constipation.  Genitourinary: Negative for dysuria.  Neurological: Negative for dizziness. Headaches: come and go with contractions.   Physical Exam   Blood pressure 121/66, pulse 89, temperature 98.4 F (36.9 C), temperature source Oral, resp. rate 20, height 5\' 4"  (1.626 m), weight 124.286 kg (274 lb), last menstrual period 03/25/2012, SpO2 100.00%.  Physical Exam  Constitutional: She is oriented to person, place, and time. She appears well-developed and well-nourished. No distress.  HENT:  Head: Normocephalic and atraumatic.  Eyes: Conjunctivae normal and EOM are normal.  Neck: Normal range of motion. Neck supple.  Cardiovascular: Normal rate, regular rhythm and normal heart sounds.   Respiratory: Effort normal and breath sounds normal. No respiratory distress.  GI: Soft. There is no tenderness. There is no rebound and no guarding.  Musculoskeletal: Normal range of motion. She exhibits no edema  and no tenderness.  Neurological: She is alert and oriented to person, place, and time.  Skin: Skin is warm and dry.  Psychiatric: She has a normal mood and affect.   FHTs:  130, mod var, accels present, no decels.  TOCO:  Irritability, occasional ctx.  MAU Course  Procedures   Assessment and Plan  32 y.o. G10P0090 at [redacted]w[redacted]d with decreased fetal movement for a few hours this afternoon. - NST reactive - Baby moving since arrived in MAU - Discharge home, labor precautions, discussed kick counts - F/U on Wednesday in clinic  Napoleon Form 12/30/2012, 10:37 PM

## 2012-12-30 NOTE — MAU Note (Signed)
Pt aware of fetal movement now

## 2013-01-01 ENCOUNTER — Ambulatory Visit (INDEPENDENT_AMBULATORY_CARE_PROVIDER_SITE_OTHER): Payer: Medicaid Other | Admitting: Advanced Practice Midwife

## 2013-01-01 VITALS — BP 140/75 | Temp 97.3°F | Wt 269.1 lb

## 2013-01-01 DIAGNOSIS — O09299 Supervision of pregnancy with other poor reproductive or obstetric history, unspecified trimester: Secondary | ICD-10-CM

## 2013-01-01 DIAGNOSIS — K219 Gastro-esophageal reflux disease without esophagitis: Secondary | ICD-10-CM

## 2013-01-01 LAB — POCT URINALYSIS DIP (DEVICE)
Nitrite: NEGATIVE
Protein, ur: 30 mg/dL — AB
Urobilinogen, UA: 1 mg/dL (ref 0.0–1.0)

## 2013-01-01 MED ORDER — OMEPRAZOLE 20 MG PO CPDR: 20.0000 mg | DELAYED_RELEASE_CAPSULE | Freq: Two times a day (BID) | ORAL | Status: DC

## 2013-01-01 MED ORDER — ONDANSETRON HCL 8 MG PO TABS: 8.0000 mg | ORAL_TABLET | Freq: Three times a day (TID) | ORAL | Status: DC | PRN

## 2013-01-01 NOTE — Progress Notes (Signed)
Needs refill on Zofran. Reviewed cautions about constipation.  Also wants PO acid reducer, tums not working well. Rx provided. Membranes swept

## 2013-01-01 NOTE — Progress Notes (Signed)
P=97, States came to MAU 1/6 because hadn't felt baby move for 5 hours, but then moved when put on monitor, since then is feeling baby move well.

## 2013-01-01 NOTE — Patient Instructions (Addendum)
Normal Labor and Delivery  Your caregiver must first be sure you are in labor. Signs of labor include:  · You may pass what is called "the mucus plug" before labor begins. This is a small amount of blood stained mucus.  · Regular uterine contractions.  · The time between contractions get closer together.  · The discomfort and pain gradually gets more intense.  · Pains are mostly located in the back.  · Pains get worse when walking.  · The cervix (the opening of the uterus becomes thinner (begins to efface) and opens up (dilates).  Once you are in labor and admitted into the hospital or care center, your caregiver will do the following:  · A complete physical examination.  · Check your vital signs (blood pressure, pulse, temperature and the fetal heart rate).  · Do a vaginal examination (using a sterile glove and lubricant) to determine:  · The position (presentation) of the baby (head [vertex] or buttock first).  · The level (station) of the baby's head in the birth canal.  · The effacement and dilatation of the cervix.  · You may have your pubic hair shaved and be given an enema depending on your caregiver and the circumstance.  · An electronic monitor is usually placed on your abdomen. The monitor follows the length and intensity of the contractions, as well as the baby's heart rate.  · Usually, your caregiver will insert an IV in your arm with a bottle of sugar water. This is done as a precaution so that medications can be given to you quickly during labor or delivery.  NORMAL LABOR AND DELIVERY IS DIVIDED UP INTO 3 STAGES:  First Stage  This is when regular contractions begin and the cervix begins to efface and dilate. This stage can last from 3 to 15 hours. The end of the first stage is when the cervix is 100% effaced and 10 centimeters dilated. Pain medications may be given by   · Injection (morphine, demerol, etc.)  · Regional anesthesia (spinal, caudal or epidural, anesthetics given in different locations of  the spine). Paracervical pain medication may be given, which is an injection of and anesthetic on each side of the cervix.  A pregnant woman may request to have "Natural Childbirth" which is not to have any medications or anesthesia during her labor and delivery.  Second Stage  This is when the baby comes down through the birth canal (vagina) and is born. This can take 1 to 4 hours. As the baby's head comes down through the birth canal, you may feel like you are going to have a bowel movement. You will get the urge to bear down and push until the baby is delivered. As the baby's head is being delivered, the caregiver will decide if an episiotomy (a cut in the perineum and vagina area) is needed to prevent tearing of the tissue in this area. The episiotomy is sewn up after the delivery of the baby and placenta. Sometimes a mask with nitrous oxide is given for the mother to breath during the delivery of the baby to help if there is too much pain. The end of Stage 2 is when the baby is fully delivered. Then when the umbilical cord stops pulsating it is clamped and cut.  Third Stage  The third stage begins after the baby is completely delivered and ends after the placenta (afterbirth) is delivered. This usually takes 5 to 30 minutes. After the placenta is delivered, a medication   is given either by intravenous or injection to help contract the uterus and prevent bleeding. The third stage is not painful and pain medication is usually not necessary. If an episiotomy was done, it is repaired at this time.  After the delivery, the mother is watched and monitored closely for 1 to 2 hours to make sure there is no postpartum bleeding (hemorrhage). If there is a lot of bleeding, medication is given to contract the uterus and stop the bleeding.  Document Released: 09/19/2008 Document Revised: 03/04/2012 Document Reviewed: 09/19/2008  [employer]® Patient Information ©2013 [employer].

## 2013-01-02 ENCOUNTER — Inpatient Hospital Stay (HOSPITAL_COMMUNITY)
Admission: AD | Admit: 2013-01-02 | Discharge: 2013-01-03 | DRG: 774 | Disposition: A | Discharge status: Home / Self Care | Payer: Medicaid Other | Source: Ambulatory Visit | Attending: Obstetrics & Gynecology | Admitting: Obstetrics & Gynecology

## 2013-01-02 ENCOUNTER — Encounter (HOSPITAL_COMMUNITY): Payer: Self-pay | Admitting: Anesthesiology

## 2013-01-02 ENCOUNTER — Inpatient Hospital Stay (HOSPITAL_COMMUNITY): Payer: Medicaid Other

## 2013-01-02 ENCOUNTER — Inpatient Hospital Stay (HOSPITAL_COMMUNITY): Payer: Medicaid Other | Admitting: Anesthesiology

## 2013-01-02 ENCOUNTER — Encounter (HOSPITAL_COMMUNITY): Payer: Self-pay

## 2013-01-02 DIAGNOSIS — N870 Mild cervical dysplasia: Secondary | ICD-10-CM

## 2013-01-02 DIAGNOSIS — O288 Other abnormal findings on antenatal screening of mother: Secondary | ICD-10-CM

## 2013-01-02 DIAGNOSIS — Z3480 Encounter for supervision of other normal pregnancy, unspecified trimester: Secondary | ICD-10-CM

## 2013-01-02 DIAGNOSIS — O1002 Pre-existing essential hypertension complicating childbirth: Secondary | ICD-10-CM | POA: Diagnosis present

## 2013-01-02 LAB — CBC
HCT: 36.8 % (ref 36.0–46.0)
MCHC: 33.7 g/dL (ref 30.0–36.0)
Platelets: 283 10*3/uL (ref 150–400)
RDW: 14.2 % (ref 11.5–15.5)
WBC: 18 10*3/uL — ABNORMAL HIGH (ref 4.0–10.5)

## 2013-01-02 LAB — TYPE AND SCREEN: ABO/RH(D): O POS

## 2013-01-02 LAB — RPR: RPR Ser Ql: NONREACTIVE

## 2013-01-02 MED ORDER — OXYTOCIN 40 UNITS IN LACTATED RINGERS INFUSION - SIMPLE MED
1.0000 m[IU]/min | INTRAVENOUS | Status: DC
  Administered 2013-01-02: 2 m[IU]/min via INTRAVENOUS

## 2013-01-02 MED ORDER — LACTATED RINGERS IV SOLN: 500.0000 mL | Freq: Once | INTRAVENOUS | Status: DC

## 2013-01-02 MED ORDER — LACTATED RINGERS IV SOLN
INTRAVENOUS | Status: DC
  Administered 2013-01-02 (×2): via INTRAVENOUS

## 2013-01-02 MED ORDER — TERBUTALINE SULFATE 1 MG/ML IJ SOLN: 0.2500 mg | Freq: Once | INTRAMUSCULAR | Status: AC | PRN

## 2013-01-02 MED ORDER — LIDOCAINE HCL (PF) 1 % IJ SOLN
INTRAMUSCULAR | Status: DC | PRN
  Administered 2013-01-02 (×4): 4 mL

## 2013-01-02 MED ORDER — PHENYLEPHRINE 40 MCG/ML (10ML) SYRINGE FOR IV PUSH (FOR BLOOD PRESSURE SUPPORT)
80.0000 ug | PREFILLED_SYRINGE | INTRAVENOUS | Status: DC | PRN
  Filled 2013-01-02: qty 5

## 2013-01-02 MED ORDER — FLEET ENEMA 7-19 GM/118ML RE ENEM: 1.0000 | ENEMA | RECTAL | Status: DC | PRN

## 2013-01-02 MED ORDER — FENTANYL 2.5 MCG/ML BUPIVACAINE 1/10 % EPIDURAL INFUSION (WH - ANES)
14.0000 mL/h | INTRAMUSCULAR | Status: DC
  Administered 2013-01-02 (×2): 14 mL/h via EPIDURAL
  Filled 2013-01-02 (×3): qty 125

## 2013-01-02 MED ORDER — OXYTOCIN BOLUS FROM INFUSION: 500.0000 mL | INTRAVENOUS | Status: DC

## 2013-01-02 MED ORDER — EPHEDRINE 5 MG/ML INJ
10.0000 mg | INTRAVENOUS | Status: DC | PRN
  Filled 2013-01-02: qty 4

## 2013-01-02 MED ORDER — EPHEDRINE 5 MG/ML INJ
10.0000 mg | INTRAVENOUS | Status: DC | PRN
  Administered 2013-01-03: 10 mg via INTRAVENOUS
  Filled 2013-01-02: qty 4

## 2013-01-02 MED ORDER — OXYTOCIN 40 UNITS IN LACTATED RINGERS INFUSION - SIMPLE MED
62.5000 mL/h | INTRAVENOUS | Status: DC
  Filled 2013-01-02: qty 1000

## 2013-01-02 MED ORDER — CITRIC ACID-SODIUM CITRATE 334-500 MG/5ML PO SOLN
30.0000 mL | ORAL | Status: DC | PRN
  Administered 2013-01-02 (×2): 30 mL via ORAL
  Filled 2013-01-02 (×2): qty 15

## 2013-01-02 MED ORDER — DIPHENHYDRAMINE HCL 50 MG/ML IJ SOLN: 12.5000 mg | INTRAMUSCULAR | Status: DC | PRN

## 2013-01-02 MED ORDER — OXYCODONE-ACETAMINOPHEN 5-325 MG PO TABS: 1.0000 | ORAL_TABLET | ORAL | Status: DC | PRN

## 2013-01-02 MED ORDER — LACTATED RINGERS IV SOLN
500.0000 mL | INTRAVENOUS | Status: DC | PRN
  Administered 2013-01-02 (×2): 500 mL via INTRAVENOUS
  Administered 2013-01-02: 1000 mL via INTRAVENOUS
  Administered 2013-01-03: 500 mL via INTRAVENOUS

## 2013-01-02 MED ORDER — ONDANSETRON HCL 4 MG/2ML IJ SOLN
4.0000 mg | Freq: Four times a day (QID) | INTRAMUSCULAR | Status: DC | PRN
  Administered 2013-01-02: 4 mg via INTRAVENOUS
  Filled 2013-01-02: qty 2

## 2013-01-02 MED ORDER — FENTANYL CITRATE 0.05 MG/ML IJ SOLN: 100.0000 ug | INTRAMUSCULAR | Status: DC | PRN

## 2013-01-02 MED ORDER — ACETAMINOPHEN 325 MG PO TABS: 650.0000 mg | ORAL_TABLET | ORAL | Status: DC | PRN

## 2013-01-02 MED ORDER — IBUPROFEN 600 MG PO TABS: 600.0000 mg | ORAL_TABLET | Freq: Four times a day (QID) | ORAL | Status: DC | PRN

## 2013-01-02 MED ORDER — LIDOCAINE HCL (PF) 1 % IJ SOLN
30.0000 mL | INTRAMUSCULAR | Status: DC | PRN
  Filled 2013-01-02: qty 30

## 2013-01-02 NOTE — H&P (Signed)
Alison Mcdonald is a 32 y.o. female presenting for active labor and non-reactive NST   G10P0090 at [redacted]w[redacted]d here with contractions since 5 AM today, regular, strong, getting more intense. Changed from 1 to 3 cm in MAU. BPP 6/10:  Non-reactive NST and minus 2 for fetal breathing.  . Maternal Medical History:  Reason for admission: Reason for admission: contractions.  Contractions: Onset was 3-5 hours ago.    Fetal activity: Perceived fetal activity is normal.   Last perceived fetal movement was within the past hour.    Prenatal complications: Hypertension (Has had 140s/80s-90s off and on since 16 wks. No meds.).   Prenatal Complications - Diabetes: none.    OB History    Grav Para Term Preterm Abortions TAB SAB Ect Mult Living   10    9 1 8         Past Medical History  Diagnosis Date  . Anemia   . 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 Dilation and curettage of uterus   . Colposcopy 12/2011   Family History: family history includes Asthma in her brother; Deep vein thrombosis in her mother; Diabetes in her father; Heart disease in her mother; and Hypertension in her father and mother. Social History:  reports that she quit smoking about 7 months ago. Her smoking use included Cigarettes. She smoked .3 packs per day. She has never used smokeless tobacco. She reports that she does not drink alcohol or use illicit drugs.   Prenatal Transfer Tool  Maternal Diabetes: No Genetic Screening: Abnormal:  Results: Other:Lab error, no results available. Maternal Ultrasounds/Referrals: Normal Fetal Ultrasounds or other Referrals:  None Maternal Substance Abuse:  No Significant Maternal Medications:  None Significant Maternal Lab Results:  Lab values include: Group B Strep negative Other Comments:  BPP 6/10 today: non-reactive NST and minus 2 for fetal breathing.  Review of Systems  Constitutional: Negative for fever and chills.  Eyes: Negative for  blurred vision and double vision.  Neurological: Negative for headaches.    Dilation: 3 Effacement (%): 70 Station: -2 Exam by:: jolynn Blood pressure 102/53, pulse 86, temperature 97.9 F (36.6 C), temperature source Oral, resp. rate 18, height 5\' 4"  (1.626 m), weight 122.471 kg (270 lb), last menstrual period 03/25/2012, SpO2 100.00%. Maternal Exam:  Uterine Assessment: Contraction strength is firm.  Contraction frequency is regular.   Abdomen: Patient reports no abdominal tenderness. Fetal presentation: vertex  Cervix: Cervix evaluated by digital exam.     Fetal Exam Fetal Monitor Review: Mode: ultrasound.   Baseline rate: 140.  Variability: moderate (6-25 bpm).   Pattern: no accelerations, late decelerations and early decelerations.   Decels initially in MAU difficult to characterize as contractions not picking up. Now in room, had one decel that appears to be early, another that may be late but started between two contraction peaks and recovered by the end of the second contraction (doublet).   Fetal State Assessment: Category II - tracings are indeterminate.     Physical Exam  Constitutional: She is oriented to person, place, and time. She appears well-developed and well-nourished. She appears distressed (during contraction).  HENT:  Head: Normocephalic and atraumatic.  Eyes: Conjunctivae normal and EOM are normal.  Neck: Normal range of motion. Neck supple.  Cardiovascular: Normal rate.   Respiratory: Effort normal. No respiratory distress.  GI: There is no rebound and no guarding.  Musculoskeletal: Normal range of motion. She exhibits edema (  mild). She exhibits no tenderness.  Neurological: She is alert and oriented to person, place, and time.  Skin: Skin is warm and dry.    Filed Vitals:   01/02/13 1120  BP: 121/60  Pulse: 93  Temp:   Resp: 16     Prenatal labs: ABO, Rh: --/--/O POS (07/02 1610) Antibody: NEG (11/13 1440) Rubella: 92.6 (11/13  1440) RPR: NON REAC (10/16 1119)  HBsAg:    HIV: NON REACTIVE (10/16 1119)  GBS: Negative (12/11 0000)   Assessment/Plan: 32 y.o. G10P0090 at [redacted]w[redacted]d with spontaneous onset of labor. - Will allow natural progression without pitocin for now. - 6/10 BPP:  Proceed to delivery, monitor FHTs carefully. - Pt desires epidural, orders in, labs pending.   Napoleon Form 01/02/2013, 10:12 AM

## 2013-01-02 NOTE — ED Provider Notes (Signed)
32 y.o. G10P0090 at [redacted]w[redacted]d presenting for increasing contractions over the last day. She noted her mucous plug fell out this morning around 3:30. She continues to feel fetal movement, and has not noted any bloody discharge. Pregnancy has been complicated by [facility], though she has not required medication.  ROS: positive for Headache, negative for abdominal pain other than contractions, increased swelling, or changes in vision. All other systems reviewed and negative  PE:  Filed Vitals:   01/02/13 0728  BP: 98/63  Pulse: 86  Temp:   Resp: 20    Gen: mild discomfort, mother and FOB at bedside HEENT: PERRLA, MMM, no thyromegaly Lungs: CTAB CV: RRR no MRG, good peripheral pulses, no edema Abd: gravid Pelvic: per nursing 1-2/70 FHR: NST non reactive  AP: 31 y.o. G10P0090 at [redacted]w[redacted]d presenting for increasing contractions  NST non reactive, sending for BPP Care transferred to Dr. Thad Ranger

## 2013-01-02 NOTE — Anesthesia Procedure Notes (Signed)
Epidural Patient location during procedure: OB Start time: 01/02/2013 10:55 AM  Staffing Performed by: anesthesiologist   Preanesthetic Checklist Completed: patient identified, site marked, surgical consent, pre-op evaluation, timeout performed, IV checked, risks and benefits discussed and monitors and equipment checked  Epidural Patient position: sitting Prep: site prepped and draped and DuraPrep Patient monitoring: continuous pulse ox and blood pressure Approach: midline Injection technique: LOR air  Needle:  Needle type: Tuohy  Needle gauge: 17 G Needle length: 9 cm and 9 Needle insertion depth: 7 cm Catheter type: closed end flexible Catheter size: 19 Gauge Catheter at skin depth: 14 cm Test dose: negative  Assessment Events: blood not aspirated, injection not painful, no injection resistance, negative IV test and no paresthesia  Additional Notes Discussed risk of headache, infection, bleeding, nerve injury and failed or incomplete block.  Patient voices understanding and wishes to proceed.  Epidural placed easily on first attempt.  Catheter withdrawn to 12 cm at skin, retracted to 14 cm in adipose when patient straightened back prior to taping, so secured at 14 cm at skin.  Patient tolerated procedure well with no apparent complications.  Jasmine December, MD Reason for block:procedure for pain

## 2013-01-02 NOTE — MAU Note (Signed)
Ctx's started at 1630 yesterday, worse since 0330 this morning when she lost her mucous plug.

## 2013-01-02 NOTE — Anesthesia Preprocedure Evaluation (Signed)
Anesthesia Evaluation  Patient identified by MRN, date of birth, ID band Patient awake    Reviewed: Allergy & Precautions, H&P , NPO status , Patient's Chart, lab work & pertinent test results, reviewed documented beta blocker date and time   History of Anesthesia Complications Negative for: history of anesthetic complications  Airway Mallampati: II TM Distance: >3 FB Neck ROM: full    Dental  (+) Teeth Intact   Pulmonary neg pulmonary ROS,  breath sounds clear to auscultation        Cardiovascular negative cardio ROS  Rhythm:regular Rate:Normal     Neuro/Psych negative neurological ROS  negative psych ROS   GI/Hepatic negative GI ROS, Neg liver ROS,   Endo/Other  Morbid obesity  Renal/GU negative Renal ROS  negative genitourinary   Musculoskeletal   Abdominal   Peds  Hematology negative hematology ROS (+)   Anesthesia Other Findings   Reproductive/Obstetrics (+) Pregnancy                           Anesthesia Physical Anesthesia Plan  ASA: III  Anesthesia Plan: Epidural   Post-op Pain Management:    Induction:   Airway Management Planned:   Additional Equipment:   Intra-op Plan:   Post-operative Plan:   Informed Consent: I have reviewed the patients History and Physical, chart, labs and discussed the procedure including the risks, benefits and alternatives for the proposed anesthesia with the patient or authorized representative who has indicated his/her understanding and acceptance.     Plan Discussed with:   Anesthesia Plan Comments:         Anesthesia Quick Evaluation

## 2013-01-02 NOTE — MAU Note (Signed)
contractions 

## 2013-01-02 NOTE — Progress Notes (Signed)
Kalaya Infantino is a 32 y.o. G10P0090 at [redacted]w[redacted]d in early labor Subjective:   Objective: BP 106/71  Pulse 109  Temp 97.9 F (36.6 C) (Oral)  Resp 16  Ht 5\' 4"  (1.626 m)  Wt 270 lb (122.471 kg)  BMI 46.35 kg/m2  SpO2 100%  LMP 03/25/2012      FHT:  FHR: 140 bpm, variability: moderate,  accelerations:  Present,  decelerations:  Absent UC:   Difficult to monitor. q2-5 SVE 5/80/-3 Labs: Lab Results  Component Value Date   WBC 18.0* 01/02/2013   HGB 12.4 01/02/2013   HCT 36.8 01/02/2013   MCV 85.0 01/02/2013   PLT 283 01/02/2013    Assessment / Plan: Augmentation of labor, progressing well  Labor: Progressing on Pitocin, will continue to increase then AROM Preeclampsia:  no signs or symptoms of toxicity Fetal Wellbeing:  Category I Pain Control:  Epidural I/D:  n/a Anticipated MOD:  NSVD  LEGGETT,KELLY H. 01/02/2013, 6:07 PM

## 2013-01-03 ENCOUNTER — Encounter (HOSPITAL_COMMUNITY): Payer: Self-pay | Admitting: *Deleted

## 2013-01-03 DIAGNOSIS — O1002 Pre-existing essential hypertension complicating childbirth: Secondary | ICD-10-CM

## 2013-01-03 MED ORDER — ZOLPIDEM TARTRATE 5 MG PO TABS: 5.0000 mg | ORAL_TABLET | Freq: Every evening | ORAL | Status: DC | PRN

## 2013-01-03 MED ORDER — ONDANSETRON HCL 4 MG/2ML IJ SOLN: 4.0000 mg | INTRAMUSCULAR | Status: DC | PRN

## 2013-01-03 MED ORDER — OXYCODONE-ACETAMINOPHEN 5-325 MG PO TABS: 1.0000 | ORAL_TABLET | ORAL | Status: DC | PRN

## 2013-01-03 MED ORDER — DIBUCAINE 1 % RE OINT: 1.0000 "application " | TOPICAL_OINTMENT | RECTAL | Status: DC | PRN

## 2013-01-03 MED ORDER — PRENATAL MULTIVITAMIN CH
1.0000 | ORAL_TABLET | Freq: Every day | ORAL | Status: DC
  Administered 2013-01-03: 1 via ORAL
  Filled 2013-01-03: qty 1

## 2013-01-03 MED ORDER — IBUPROFEN 600 MG PO TABS: 600.0000 mg | ORAL_TABLET | Freq: Four times a day (QID) | ORAL | Status: DC

## 2013-01-03 MED ORDER — LANOLIN HYDROUS EX OINT: TOPICAL_OINTMENT | CUTANEOUS | Status: DC | PRN

## 2013-01-03 MED ORDER — SODIUM BICARBONATE 8.4 % IV SOLN
INTRAVENOUS | Status: DC | PRN
  Administered 2013-01-03: 5 mL via EPIDURAL

## 2013-01-03 MED ORDER — SENNOSIDES-DOCUSATE SODIUM 8.6-50 MG PO TABS
2.0000 | ORAL_TABLET | Freq: Every day | ORAL | Status: DC
  Administered 2013-01-03: 2 via ORAL

## 2013-01-03 MED ORDER — SIMETHICONE 80 MG PO CHEW: 80.0000 mg | CHEWABLE_TABLET | ORAL | Status: DC | PRN

## 2013-01-03 MED ORDER — IBUPROFEN 600 MG PO TABS
600.0000 mg | ORAL_TABLET | Freq: Four times a day (QID) | ORAL | Status: DC
  Administered 2013-01-03 (×2): 600 mg via ORAL
  Filled 2013-01-03 (×2): qty 1

## 2013-01-03 MED ORDER — TETANUS-DIPHTH-ACELL PERTUSSIS 5-2.5-18.5 LF-MCG/0.5 IM SUSP: 0.5000 mL | Freq: Once | INTRAMUSCULAR | Status: DC

## 2013-01-03 MED ORDER — DIPHENHYDRAMINE HCL 25 MG PO CAPS: 25.0000 mg | ORAL_CAPSULE | Freq: Four times a day (QID) | ORAL | Status: DC | PRN

## 2013-01-03 MED ORDER — WITCH HAZEL-GLYCERIN EX PADS: 1.0000 "application " | MEDICATED_PAD | CUTANEOUS | Status: DC | PRN

## 2013-01-03 MED ORDER — BENZOCAINE-MENTHOL 20-0.5 % EX AERO: 1.0000 "application " | INHALATION_SPRAY | CUTANEOUS | Status: DC | PRN

## 2013-01-03 MED ORDER — ONDANSETRON HCL 4 MG PO TABS: 4.0000 mg | ORAL_TABLET | ORAL | Status: DC | PRN

## 2013-01-03 NOTE — Discharge Summary (Signed)
Obstetric Discharge Summary Reason for Admission: induction of labor and observation/evaluation was 3cm upon admission; admitted due to BPP and required Pitocin augmentation to achieve labor Prenatal Procedures: NST and BPP 6/10 (nonreactive NST and no breathing) Intrapartum Procedures: spontaneous vaginal delivery Postpartum Procedures: none Complications-Operative and Postpartum: none Hemoglobin  Date Value Range Status  01/02/2013 12.4  12.0 - 15.0 g/dL Final     HCT  Date Value Range Status  01/02/2013 36.8  36.0 - 46.0 % Final    Physical Exam:  General: alert, cooperative and no distress Lochia: appropriate Uterine Fundus: firm DVT Evaluation: No evidence of DVT seen on physical exam.  Discharge Diagnoses: Term Pregnancy-delivered  Discharge Information: Date: 01/03/2013 Activity: pelvic rest Diet: routine Medications: PNV and Ibuprofen Condition: stable Instructions: refer to practice specific booklet Discharge to: home Follow-up Information    Follow up with [facility]. (Make postpartum appointment for 4-6 weeks)    Contact information:   [address] [place] [address]          Newborn Data: Live born female  Birth Weight: 6 lb 6.3 oz (2900 g) APGAR: 8, 8  Being transferred to [facility] for heart concerns; pt requesting early d/c- doing well; pumping; contraception not discussed due to pt's fragile emotional state.  Cam Hai 01/03/2013, 9:33 PM

## 2013-01-03 NOTE — Progress Notes (Signed)
Laquanda Bick is a 32 y.o. G10P0090 at [redacted]w[redacted]d admitted for induction of labor due to BPP 6/10.  Subjective: Pt feeling rectal pressure with contractions.    Objective: BP 114/48  Pulse 87  Temp 99.5 F (37.5 C) (Axillary)  Resp 18  Ht 5\' 4"  (1.626 m)  Wt 122.471 kg (270 lb)  BMI 46.35 kg/m2  SpO2 100%  LMP 03/25/2012   Total I/O In: -  Out: 600 [Urine:600]  FHT:  FHR: 140 bpm, variability: moderate,  accelerations:  Present,  decelerations:  Present variables with pushing UC:   regular, every 3 minutes SVE:   Dilation: 10 Effacement (%): 100 Station: -1;0 Exam by:: GPayne  Labs: Lab Results  Component Value Date   WBC 18.0* 01/02/2013   HGB 12.4 01/02/2013   HCT 36.8 01/02/2013   MCV 85.0 01/02/2013   PLT 283 01/02/2013    Assessment / Plan: Spontaneous labor, progressing normally Begin Pushing   Labor: Progressing normally  Fetal Wellbeing:  Category I Pain Control:  Epidural  Anticipated MOD:  NSVD  LEFTWICH-KIRBY, Xaiden Fleig 01/03/2013, 6:17 AM

## 2013-01-03 NOTE — Progress Notes (Signed)
Alison Mcdonald is a 32 y.o. G10P0090 at [redacted]w[redacted]d admitted for induction of labor due to nonreassuring BPP of 6/10.  Subjective: Pt comfortable with epidural.  Family at bedside for support.  Objective: BP 116/66  Pulse 79  Temp 99.2 F (37.3 C) (Axillary)  Resp 18  Ht 5\' 4"  (1.626 m)  Wt 122.471 kg (270 lb)  BMI 46.35 kg/m2  SpO2 100%  LMP 03/25/2012   Total I/O In: -  Out: 300 [Urine:300]  FHT:  FHR: 130 bpm, variability: moderate,  accelerations:  Present,  decelerations:  Present prolonged decel x2 minutes x1 episode, resolved with pt position change and pitocin off x 1 hour.   UC:   regular, every 3-4 minutes SVE:   Dilation: 5 Effacement (%): 80 Station: -3;-2 Exam by:: Leffwich-Kirby Vertex, head well applied to cervix with BBOW AROM with light meconium IUPC placed without difficulty Pt tolerated procedures well  Labs: Lab Results  Component Value Date   WBC 18.0* 01/02/2013   HGB 12.4 01/02/2013   HCT 36.8 01/02/2013   MCV 85.0 01/02/2013   PLT 283 01/02/2013    Assessment / Plan: Induction of labor due to nonreassuring BPP,  progressing well on pitocin  Labor: Progressing normally  Fetal Wellbeing:  Category I Pain Control:  Epidural  Anticipated MOD:  NSVD  LEFTWICH-KIRBY, Alison Mcdonald 01/03/2013, 1:28 AM

## 2013-01-03 NOTE — ED Provider Notes (Signed)
I saw and examined patient and agree with above. See H&P. Patient admitted for non-reactive NST. Napoleon Form, MD

## 2013-01-03 NOTE — Progress Notes (Signed)
Alison Mcdonald is a 32 y.o. G10P0090 at [redacted]w[redacted]d   Subjective: Uncomfortable, feeling the urge to push, though not yet complete.  Objective: BP 104/53  Pulse 92  Temp 99.2 F (37.3 C) (Axillary)  Resp 18  Ht 5\' 4"  (1.626 m)  Wt 122.471 kg (270 lb)  BMI 46.35 kg/m2  SpO2 100%  LMP 03/25/2012   Total I/O In: -  Out: 300 [Urine:300]  FHT:  FHR: 150 bpm, variability: moderate,  accelerations:  Abscent,  decelerations:  Absent UC:   regular, every 2 minutes SVE:   Dilation: 6 Effacement (%): 90 Station: -2;-1 Exam by:: Geni Bers, RN  Labs: Lab Results  Component Value Date   WBC 18.0* 01/02/2013   HGB 12.4 01/02/2013   HCT 36.8 01/02/2013   MCV 85.0 01/02/2013   PLT 283 01/02/2013    Assessment / Plan: Augmentation of labor, progressing well  Labor: progressing on pitocin, will increase for adequate contractions Epidural dose for increased comfort Fetal Wellbeing:  Category I Pain Control:  Epidural Anticipated MOD:  NSVD  CONROY, LOUISA 01/03/2013, 4:04 AM  I have seen this patient and agree with the above resident's note.  LEFTWICH-KIRBY, LISA Certified Nurse-Midwife

## 2013-01-03 NOTE — Progress Notes (Signed)
UR chart review completed.  

## 2013-01-03 NOTE — Anesthesia Postprocedure Evaluation (Signed)
Anesthesia Post Note  Patient: Alison Mcdonald  Procedure(s) Performed: * No procedures listed *  Anesthesia type: Epidural  Patient location: Mother/Baby  Post pain: Pain level controlled  Post assessment: Post-op Vital signs reviewed  Last Vitals:  Filed Vitals:   01/03/13 1645  BP: 138/87  Pulse: 71  Temp: 36.5 C  Resp: 18    Post vital signs: Reviewed  Level of consciousness:alert  Complications: No apparent anesthesia complications

## 2013-01-08 ENCOUNTER — Other Ambulatory Visit: Payer: Medicaid Other

## 2013-01-11 NOTE — H&P (Signed)
Agree with above note.  [place] Giammona H. 01/11/2013 11:56 AM

## 2013-02-03 ENCOUNTER — Ambulatory Visit (INDEPENDENT_AMBULATORY_CARE_PROVIDER_SITE_OTHER): Payer: Medicaid Other | Admitting: Advanced Practice Midwife

## 2013-02-03 ENCOUNTER — Encounter: Payer: Self-pay | Admitting: Advanced Practice Midwife

## 2013-02-03 DIAGNOSIS — R87619 Unspecified abnormal cytological findings in specimens from cervix uteri: Secondary | ICD-10-CM

## 2013-02-03 DIAGNOSIS — F329 Major depressive disorder, single episode, unspecified: Secondary | ICD-10-CM

## 2013-02-03 DIAGNOSIS — F4321 Adjustment disorder with depressed mood: Secondary | ICD-10-CM | POA: Insufficient documentation

## 2013-02-03 DIAGNOSIS — O99345 Other mental disorders complicating the puerperium: Secondary | ICD-10-CM

## 2013-02-03 DIAGNOSIS — Z634 Disappearance and death of family member: Secondary | ICD-10-CM | POA: Insufficient documentation

## 2013-02-03 DIAGNOSIS — F53 Postpartum depression: Secondary | ICD-10-CM

## 2013-02-03 MED ORDER — FLUOXETINE HCL 20 MG PO TABS: 20.0000 mg | ORAL_TABLET | Freq: Every day | ORAL | Status: DC

## 2013-02-03 MED ORDER — ZOLPIDEM TARTRATE 5 MG PO TABS: 5.0000 mg | ORAL_TABLET | Freq: Every evening | ORAL | Status: DC | PRN

## 2013-02-03 NOTE — Progress Notes (Signed)
CSW met with pt to offer resources on grief counseling. Pt was appropriately emotional, as she talked about her son passing away on 01/26/13. She told CSW that she and FOB are having a difficult time dealing with their loss. CSW provided pt with [facility] information and made a referral to agency. Pt denies any SI/HI. CSW will follow up with pt later to week to verify that she is connected with [facility]. Pt thanked CSW for resource.       

## 2013-02-03 NOTE — Progress Notes (Signed)
Patient here for postpartum visit- patient informed nurse her baby died yesterday at [facility] - + postpartum depression screen-does not have any resources for comfort group,etc- SW notified and will see patient today.

## 2013-02-03 NOTE — Progress Notes (Signed)
Patient ID: Alison Mcdonald, female   DOB: 27-Jun-1981, 32 y.o.   MRN: [id number] Subjective:     Alison Mcdonald is a 32 y.o. female who presents for a postpartum visit. She is 4 weeks postpartum following a spontaneous vaginal delivery. I have fully reviewed the prenatal and intrapartum course. The delivery was at 39.5 gestational weeks. Outcome: spontaneous vaginal delivery. Anesthesia: epidural. Postpartum course has been uncomplicated. Baby's course has been complicated by congential problems with baby's lungs. Baby was transferred to [facility] shortly after birth, remained there until he passed away 8 days ago. [name] states "I am a mess" and reports being reasonably grief stricken. She states she has good family support, but is feeling depressed, frequently tearful, have trouble sleeping. Requests medication for depression and to help with sleep. Denies SI/HI.  Bleeding staining only. Bowel function is normal. Bladder function is normal. Patient is not sexually active. Contraception method is none. Postpartum depression screening: positive.  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  Review of Systems Review of Systems  Constitutional: Negative.   HENT: Negative.   Respiratory: Negative.   Cardiovascular: Negative.   Genitourinary: Negative.   Skin:       Draining abscess on abdomen - states this is recurrent   Neurological: Negative.   Psychiatric/Behavioral: Positive for depression. Negative for suicidal ideas and substance abuse. The patient is nervous/anxious and has insomnia.      Objective:    BP 139/93  Pulse 80  Temp(Src) 99.1 F (37.3 C)  Wt 260 lb 4.8 oz (118.071 kg)  BMI 44.66 kg/m2  Physical Exam  Nursing note and vitals  reviewed. Constitutional: She is oriented to person, place, and time. She appears well-developed and well-nourished. No distress.  Obese   Cardiovascular: Normal rate.   Pulmonary/Chest: Effort normal.  Abdominal: Soft. There is no tenderness.  Genitourinary: Vagina normal and uterus normal.  Musculoskeletal: Normal range of motion.  Neurological: She is alert and oriented to person, place, and time.  Skin: Skin is warm and dry.  Psychiatric: She has a normal mood and affect. Her behavior is normal. Judgment and thought content normal. Cognition and memory are normal.  Pt is tearful at appropriate times, clearly grief stricken, but expresses appropriate coping skills and strategies, shows pictures of baby and relates story of his life, reports supportive family at home, she and her partner are discussing going to counseling or a support group together          Assessment:     4 week postpartum exam. Pap smear not done at today's visit, needs colpo  1. Postpartum care following vaginal delivery   2. Postpartum depression, postpartum condition   3. Grief at loss of child   4. Abnormal Pap smear of cervix    .   Plan:    1. Contraception: condoms for now - patient states she plans to try for pregnancy again in about 6 months 2. Prozac 20 mg qday - rx sent to pharmacy 3. Ambien 5 mg qHS PRN  4. Social worker saw in clinic today to provide grief support resources 5. Rev'd depression precautions 5. Follow up in: 1 month for colpo or as needed.  Pt will let us know how she is doing with depression/sleep at her next visit.

## 2013-02-03 NOTE — Patient Instructions (Signed)
Colposcopy Colposcopy is a procedure that uses a special lighted microscope (colposcope). It examines your cervix and vagina, or the area around the outside of the vagina, for signs of disease or abnormalities in the cells. You may be sent to a specialist (gynecologist) to do the colposcopy. A biopsy (tissue sample) may be collected during a colposcopy, if the caregiver finds any unusual cells. The biopsy is sent to the lab for further testing, and the results are reported back to your caregiver. A WOMAN MAY NEED THIS PROCEDURE IF:  She has had an abnormal pap smear (taking cells from the cervix for testing).  She has a sore on her cervix, and a Pap test was normal.  The Pap test suggests human papilloma virus (HPV). This virus can cause genital warts and is linked to the development of cervical cancer.  She has genital warts on the cervix, or in or around the outside of the vagina.  Her mother took the drug DES while pregnant.  She has painful intercourse.  She has vaginal bleeding, especially after sexual intercourse.  There is a need to evaluate the results of previous treatment. BEFORE THE PROCEDURE   Colposcopy is done when you are not having a menstrual period.  For 24 hours before the colposcopy, do not:  Douche.  Use tampons.  Use medicines, creams, or suppositories in the vagina.  Have sexual intercourse. PROCEDURE   A colposcopy is done while a woman is lying on her back with her feet in foot rests (stirrups).  A speculum is placed inside the vagina to keep it open and to allow the caregiver to see the cervix. This is the same instrument used to do a pap smear.  The colposcope is placed outside the vagina. It is used to magnify and examine the cervix, vagina, and the area around the outside of the vagina.  A small amount of liquid solution is placed on the area that is to be viewed. This solution is placed on with a cotton applicator. This solution makes it easier to  see the abnormal cells.  Your caregiver will suck out mucus and cells from the canal of the cervix.  Small pieces of tissue for biopsy may be taken at the same time. You may feel mild pain or discomfort when this is done.  Your caregiver will record the location of the abnormal areas and send the tissue samples to a lab for analysis.  If your caregiver biopsies the vagina or outside of the vagina, a local anesthetic (novocaine) is usually given. AFTER THE PROCEDURE   You may have some cramping that often goes away in a few minutes. You may have some soreness for a couple of days.  You may take over-the-counter pain medicine as advised by your caregiver. Do not take aspirin because it can cause bleeding.  Lie down for a few minutes if you feel lightheaded.  You may have some bleeding or dark discharge that should stop in a few days.  You may need to wear a sanitary pad for a few days. HOME CARE INSTRUCTIONS   Avoid sex, douching, and using tampons for a week or as directed.  Only take medicine as directed by your caregiver.  Continue to take birth control pills, if you are on them.  Not all test results are available during your visit. If your test results are not back during the visit, make an appointment with your caregiver to find out the results. Do not assume everything is  normal if you have not heard from your caregiver or the medical facility. It is important for you to follow up on all of your test results.  Follow your caregiver's advice regarding medicines, activity, follow-up visits, and follow-up Pap tests. SEEK MEDICAL CARE IF:   You develop a rash.  You have problems with your medicine. SEEK IMMEDIATE MEDICAL CARE IF:  You are bleeding heavily or are passing blood clots.  You develop a fever over 102 F (38.9 C), with or without chills.  You have abnormal vaginal discharge.  You are having cramps that do not go away after taking your pain medicine.  You  feel lightheaded, dizzy, or faint.  You develop stomach pain. Document Released: 03/03/2003 Document Revised: 03/04/2012 Document Reviewed: 10/14/2009 [facility] Patient Information 2013 [place], [place].  Grief Reaction Grief is a normal response to the death of someone close to you. Feelings of fear, anger, and guilt can affect almost everyone who loses someone they love. Symptoms of depression are also common. These include problems with sleep, loss of appetite, and lack of energy. These grief reaction symptoms often last for weeks to months after a loss. They may also return during special times that remind you of the person you lost, such as an anniversary or birthday. Anxiety, insomnia, irritability, and deep depression may last beyond the period of normal grief. If you experience these feelings for 6 months or longer, you may have clinical depression. Clinical depression requires further medical attention. If you think that you have clinical depression, you should contact your caregiver. If you have a history of depression and or a family history of depression, you are at greater risk of clinical depression. You are also at greater risk of developing clinical depression if the loss was traumatic or the loss was of someone with whom you had unresolved issues.  A grief reaction can become complicated by being blocked. This means being unable to cry or express extreme emotions. This may prolong the grieving period and worsen the emotional effects of the loss. Mourning is a natural event in human life. A healthy grief reaction is one that is not blocked . It requires a time of sadness and readjustment.It is very important to share your sorrow and fear with others, especially close friends and family. Professional counselors and clergy can also help you process your grief. Document Released: 12/11/2005 Document Revised: 03/04/2012 Document Reviewed: 08/21/2006 [facility] Patient Information 2013 [employer],  [place].  Depression, Adult Depression refers to feeling sad, low, down in the dumps, blue, gloomy, or empty. In general, there are two kinds of depression: 1. Depression that we all experience from time to time because of upsetting life experiences, including the loss of a job or the ending of a relationship (normal sadness or normal grief). This kind of depression is considered normal, is short lived, and resolves within a few days to 2 weeks. (Depression experienced after the loss of a loved one is called bereavement. Bereavement often lasts longer than 2 weeks but normally gets better with time.) 2. Clinical depression, which lasts longer than normal sadness or normal grief or interferes with your ability to function at home, at work, and in school. It also interferes with your personal relationships. It affects almost every aspect of your life. Clinical depression is an illness. Symptoms of depression also can be caused by conditions other than normal sadness and grief or clinical depression. Examples of these conditions are listed as follows:  Physical illness Some physical illnesses, including  underactive thyroid gland (hypothyroidism), severe anemia, specific types of cancer, diabetes, uncontrolled seizures, heart and lung problems, strokes, and chronic pain are commonly associated with symptoms of depression.  Side effects of some prescription medicine In some people, certain types of prescription medicine can cause symptoms of depression.  Substance abuse Abuse of alcohol and illicit drugs can cause symptoms of depression. SYMPTOMS Symptoms of normal sadness and normal grief include the following:  Feeling sad or crying for short periods of time.  Not caring about anything (apathy).  Difficulty sleeping or sleeping too much.  No longer able to enjoy the things you used to enjoy.  Desire to be by oneself all the time (social isolation).  Lack of energy or motivation.  Difficulty  concentrating or remembering.  Change in appetite or weight.  Restlessness or agitation. Symptoms of clinical depression include the same symptoms of normal sadness or normal grief and also the following symptoms:  Feeling sad or crying all the time.  Feelings of guilt or worthlessness.  Feelings of hopelessness or helplessness.  Thoughts of suicide or the desire to harm yourself (suicidal ideation).  Loss of touch with reality (psychotic symptoms). Seeing or hearing things that are not real (hallucinations) or having false beliefs about your life or the people around you (delusions and paranoia). DIAGNOSIS  The diagnosis of clinical depression usually is based on the severity and duration of the symptoms. Your caregiver also will ask you questions about your medical history and substance use to find out if physical illness, use of prescription medicine, or substance abuse is causing your depression. Your caregiver also may order blood tests. TREATMENT  Typically, normal sadness and normal grief do not require treatment. However, sometimes antidepressant medicine is prescribed for bereavement to ease the depressive symptoms until they resolve. The treatment for clinical depression depends on the severity of your symptoms but typically includes antidepressant medicine, counseling with a mental health professional, or a combination of both. Your caregiver will help to determine what treatment is best for you. Depression caused by physical illness usually goes away with appropriate medical treatment of the illness. If prescription medicine is causing depression, talk with your caregiver about stopping the medicine, decreasing the dose, or substituting another medicine. Depression caused by abuse of alcohol or illicit drugs abuse goes away with abstinence from these substances. Some adults need professional help in order to stop drinking or using drugs. SEEK IMMEDIATE CARE IF:  You have thoughts  about hurting yourself or others.  You lose touch with reality (have psychotic symptoms).  You are taking medicine for depression and have a serious side effect. FOR MORE INFORMATION National Alliance on Mental Illness: www.nami.[employer]: http://www.maynard.net/ Document Released: 12/08/2000 Document Revised: 06/11/2012 Document Reviewed: 03/11/2012 [facility] [place] [facility] Patient Information 2013 [place], [place].

## 2013-02-04 NOTE — Progress Notes (Signed)
CSW spoke with Cheri Timmons, staff person with [facility] this morning & confirmed that referral was received. Pt plans to make contact with Cheri when she and FOB are ready for services.       

## 2013-02-05 NOTE — Progress Notes (Signed)
CSW spoke with Sudie Grumbling, staff person with [facility] this morning & confirmed that referral was received. Pt plans to make contact with Cheri when she and FOB are ready for services.

## 2013-02-05 NOTE — Progress Notes (Signed)
CSW met with pt to offer resources on grief counseling. Pt was appropriately emotional, as she talked about her son passing away on 01/26/13. She told CSW that she and FOB are having a difficult time dealing with their loss. CSW provided pt with [facility] information and made a referral to agency. Pt denies any SI/HI. CSW will follow up with pt later to week to verify that she is connected with [facility]. Pt thanked CSW for resource.

## 2013-02-14 ENCOUNTER — Other Ambulatory Visit (HOSPITAL_COMMUNITY)
Admission: RE | Admit: 2013-02-14 | Discharge: 2013-02-14 | Disposition: A | Discharge status: Home / Self Care | Payer: Medicaid Other | Source: Ambulatory Visit | Attending: Obstetrics & Gynecology | Admitting: Obstetrics & Gynecology

## 2013-02-14 ENCOUNTER — Encounter: Payer: Self-pay | Admitting: Obstetrics & Gynecology

## 2013-02-14 ENCOUNTER — Encounter: Payer: Self-pay | Admitting: *Deleted

## 2013-02-14 ENCOUNTER — Ambulatory Visit (INDEPENDENT_AMBULATORY_CARE_PROVIDER_SITE_OTHER): Payer: Medicaid Other | Admitting: Obstetrics & Gynecology

## 2013-02-14 ENCOUNTER — Other Ambulatory Visit: Payer: Self-pay | Admitting: Obstetrics & Gynecology

## 2013-02-14 VITALS — BP 127/79 | HR 63 | Temp 96.8°F | Ht 66.0 in | Wt 257.5 lb

## 2013-02-14 DIAGNOSIS — Z01419 Encounter for gynecological examination (general) (routine) without abnormal findings: Secondary | ICD-10-CM | POA: Insufficient documentation

## 2013-02-14 DIAGNOSIS — Z1151 Encounter for screening for human papillomavirus (HPV): Secondary | ICD-10-CM | POA: Insufficient documentation

## 2013-02-14 DIAGNOSIS — D069 Carcinoma in situ of cervix, unspecified: Secondary | ICD-10-CM | POA: Insufficient documentation

## 2013-02-14 DIAGNOSIS — R87613 High grade squamous intraepithelial lesion on cytologic smear of cervix (HGSIL): Secondary | ICD-10-CM

## 2013-02-14 DIAGNOSIS — N72 Inflammatory disease of cervix uteri: Secondary | ICD-10-CM | POA: Insufficient documentation

## 2013-02-14 DIAGNOSIS — R87810 Cervical high risk human papillomavirus (HPV) DNA test positive: Secondary | ICD-10-CM | POA: Insufficient documentation

## 2013-02-14 NOTE — Patient Instructions (Signed)
Cervical Cancer The cervix is the opening and bottom part of the uterus between the birth canal (vagina) and the uterus (womb). Precancerous changes in the cells on the top layer of the cervix are an early sign that cervical cancer may develop. Cervical cancer is a fairly common cancer. It occurs most often in women ages 65 to 80. Cells of the cervix act very much like skin cells. These cells are exposed to toxins, viruses, and germs (bacteria) that may cause abnormal changes (cervical dysplasia).  Cervical dysplasia (abnormal growth) is judged by the thickness of the layer of abnormal cells. The earliest change that can be seen with a microscope is called mild dysplasia. If not treated, these precancerous changes may become moderate to severe, to cancer cells on the surface of the cervix (carcinoma in situ), to invasive cancer (cancer cells below the surface of the cervix). There are two kinds of cancers of the cervix:  Squamous (scale-like) cell carcinoma, starts in the flat or scale-like cells that line the cervix. This type of cancer is a sexually transmitted infection caused by the human papilloma virus (HPV).  Adenocarcinoma (starts on the surface of a gland) starts in glandular cells that line the cervix. CAUSES The risk of getting cancer of the cervix is related to your lifestyle, sexual history, health, and your immune system. Causes and risks include:  Having a sexually transmitted virus infection. These infections are strongly linked with cancer of the cervix. They include:  Chlamydia.  Herpes.  Human papilloma virus (HPV).  Becoming sexually active before age 23.  Having more than 1 sexual partner, or having sex with someone who has more than one sex partner.  Not using condoms with sexual partners.  Having had cancer of the vagina or vulva. Or having sex with someone whose previous sexual partner had cancer of the cervix or cervical dysplasia.  Having a sexual partner who has  or had cancer of the penis.  Smoking.  Having a weakened immune system. For example, HIV or other immune deficiency disorders (organ transplant).  Being the daughter of a woman who took DES (diethylstilbestrol) during pregnancy.  A history of cancer of the cervix in your sister or mother.  African/American, Hispanic, and Asian/Pacific Islander women have a higher risk of getting cervical cancer.  A previous history of dysplasia (abnormal growth) of the cervix. SYMPTOMS  Early stages of cervical cancer usually have no symptoms. Once the cancer invades the cervix and surrounding tissues, the woman may have vague symptoms, such as:  Abnormal vaginal bleeding or menstrual bleeding that is longer or heavier than usual.  Bleeding after intercourse, douching, or a pap test.  Vaginal bleeding following menopause.  Abnormal vaginal discharge.  Pelvic discomfort or pain.  Having an abnormal Pap test. (A Pap test is an exam where cells are scraped from the cervix and canal of the cervix, and are viewed under a microscope.) Symptoms of more advanced cervical cancer may include:  Loss of appetite or weight loss.  Tiredness (fatigue).  Back and leg pain.  Inability to control urination or bowel movements. DIAGNOSIS  Diagnosis of cervical cancer is found earliest and done best with regular pelvic exams and Pap tests. If abnormalities are found, the Pap test may be repeated in 3 months, or your caregiver may do additional tests, such as:  Colposcopy: A special microscope allows the caregiver to magnify and closely examine the cells of the cervix, vagina, and vulva.  Cervical biopsies: Small tissue samples are  taken from the cervix to be examined under a microscope by a specialist. This can be done in your caregiver's office. This is done to determine if there is dysplasia or cancer cells. You cannot tell the stage of cervical cancer with a cervical biopsy. A cone biopsy removes tissue to be  tested for cancer. It can also be used to remove all the cancerous tissue. In a cone biopsy, a large, cone shaped tissue from the cervix is taken. This procedure can be done by:  Cold cone biopsy or laser cone. Both are done in an operating room under an anesthetic (you are asleep).  LEEP (loop electrosurgical excision procedure). Can be done in a doctor's office with a local anesthetic.  Other tests may be needed, including:  Cystoscopy (looking into the bladder with a lighted tube).  Proctoscopy or Sigmoidoscopy (looking into the rectum and lower intestine with a lighted tube).  Ultrasound.  CT Scan.  MRI.  Laparoscopy (using a lighted tube for examination). There are different stages of cervical cancer:   Stage 0. CIS (carcinoma in situ). This first stage of cancer is the last and most serious stage of dysplasia (see above).  Stage 1. The tumor is in the uterus and cervix only.  Stage 2. The tumor has spread to the upper vagina. The cancer has spread beyond the uterus, but not to the pelvic walls or lower third of the vagina.  Stage 3. The tumor has invaded the side wall of the pelvis, and the lower third of the vagina. Blockage of the ureters (tubes that carry urine) from the tumor may cause urine to back up and swell the kidneys (hydronephrosis).  Stage 4. The tumor has spread to the rectum or bladder. In the later part of this stage, it has also spread to distant organs, like the lungs. If abnormal cells are found early, it may be possible to avoid removing the uterus. If caught at an early stage, a woman can still have children and chances for a cure are good. Once cervical cancer reaches a late stage, more aggressive measures may be needed. Untreated, the cancer may spread to nearby areas or more distant sites in the body, through the blood and lymphatic system. Cervical cancer is not contagious and does not pose a risk to others.  TREATMENT  Options for removal include the  following:  Cone biopsy (see above).  Removal of the entire uterus and cervix (simple hysterectomy).  If the cancer has invaded deeper layers of the cervix and has spread to the uterus, more extensive treatment may be needed. This may include removal of the uterus, cervix, upper vagina, lymph nodes, and surrounding tissue (modified radical hysterectomy). This procedure depends on the extent of the cancer and a woman's age. The ovaries may be left in place or removed.  Sometimes medicines for treating cancer (radiation and/or chemotherapy) can be used. This is done when the cancer has spread beyond the cervix and uterus. These treatments may be used if a woman is not a good candidate for surgery. Age or other medical conditions may prevent a woman from being a good candidate for surgery. Radiation therapy may be used before or after surgery to shrink tumor cells and kill any remaining tumor cells.  A combination of surgery, radiation, and chemotherapy may be needed, depending on the extent of the cancer.  Biological response modifiers (BRMs) are substances that help strengthen the immune system's fight against cancer or infection. Interferon is a BRM that  is sometimes used in the treatment of cervical cancer. It may be used in combination with chemotherapy.  Treatment of squamous cell cancer or adenocarcinoma of the cervix are essentially the same. Side effects of treatment:  A hysterectomy may cause inability to control urination or psychological sexual problems. There may be swelling in the legs if lymph nodes are removed. Occasionally, blood transfusions may be required. Allergic reactions can occur. Hemorrhage, infection, and rarely death, can occur.  Chemotherapy and radiation therapy may cause a wide variety of side effects. Including:  Hair loss.  Tiredness (fatigue).  Lessened ability to fight infections.  Feeling sick to your stomach (nausea).  Vomiting.  Diarrhea.  Urinary  problems.  Atrophic vaginitis (inflammation of the vagina) with painful intercourse.  Biological response modifiers such as interferon may cause flu-like symptoms. These include:  Body aches.  Nausea.  Vomiting.  Fatigue. Treatment of cervical cancer in a pregnant woman:  Treatment of cervical cancer in pregnant women depends on the patient's culture, religious feelings, and ethical considerations.  A pregnant woman with cancer at Stage 0 or Stage 1, with microinvasion of 3mm or less (cancerous tissue has spread to a very small area), can deliver her baby vaginally. She can then be treated 6 weeks after the delivery, with surgery.  Other factors that can determine treatment include:  Size of the tumor.  Location of the tumor.  Stage of the pregnancy.  Desire of the patient to go on with the pregnancy.  Radiation with or without chemotherapy, following delivery and/or surgery, may be advised or necessary to prevent the cancerous tumor from coming back (recurrences).  Delaying treatment until the baby has a better chance to survive is better for the baby. Follow-up on cervical cancer:  After treatment, follow-up is important to look for reoccurrence.  A pelvic exam and Pap test, if the cervix is intact, will be done every 3 months for at least 2 years. After the 2 year phase, a pelvic exam and Pap test will be done every 6 months. Because cancer tends to come back at the same spot or spread to the lungs and liver, chest x-rays and liver function tests are done yearly.  If a woman has had a hysterectomy, the top of the vagina is cuffed or closed. A colposcopy may be done at follow-up visits to examine the vaginal cuff.  Tell your caregiver about any new or worsening problems. LENGTH OF ILLNESS The outcome for a woman with cervical cancer depends on many factors, including:  Overall health.  Age when first diagnosed.  The type and growth of specific cancer cells.  How  far the disease has spread. After treatment, the length of time to live depends on the stage of the cancer. Your caregiver will discuss this with you and help you plan your treatment for the best possible outcome. PREVENTION   A Pap test is done to screen for cervical cancer.  The first Pap test should be done at age 62.  Between ages 50 and 77, Pap tests are repeated every 2 years.  Beginning at age 31, you are advised to have a Pap test every 3 years as long as your past 3 Pap tests have been normal.  Some women have medical problems that increase the chance of getting cervical cancer. Talk to your caregiver about these problems. It is especially important to talk to your caregiver if a new problem develops soon after your last Pap test. In these cases, your caregiver  may recommend more frequent screening and Pap tests.  The above recommendations are the same for women who have or have not gotten the vaccine for HPV (Human Papillomavirus).  If you had a hysterectomy for a problem that was not a cancer or a condition that could lead to cancer, then you no longer need Pap tests. However, even if you no longer need a Pap test, a regular exam is a good idea to make sure no other problems are starting.   If you are between ages 63 and 30, and you have had normal Pap tests going back 10 years, you no longer need Pap tests. However, even if you no longer need a Pap test, a regular exam is a good idea to make sure no other problems are starting.   If you have had past treatment for cervical cancer or a condition that could lead to cancer, you need Pap tests and screening for cancer for at least 20 years after your treatment.  If Pap tests have been discontinued, risk factors (such as a new sexual partner)need to be re-assessed to determine if screening should be resumed.  Some women may need screenings more often if they are at high risk for cervical cancer.  A woman can lower her risk for  getting cervical cancer by:  Quitting smoking.  Waiting to have intercourse until age 11 or later.  Having only one sexual partner in a lifetime.  Using latex condoms.  Practicing safe sex with each sexual encounter.  Remaining celibate (not having sex). Celibate women do not get squamous cell cancer of the cervix, but they can get adenocarcinoma of the cervix.  A woman should ask her sexual partners about their sexual histories. By doing this, she can avoid partners that are high risk.  Identifying early warning signs of cervical cancer is also important. A woman should see her caregiver, and may need to be treated, if she has any of the following signs or symptoms:  Vaginal discharge that does not seem normal.  Vaginal bleeding between periods.  Bleeding with intercourse or after menopause.  Pain with intercourse (dyspareunia).  Vaccines are available to help prevent certain types of human papilloma virus infection and cervical cancer. HOME CARE INSTRUCTIONS   Get a yearly gynecology examination and Pap test, or as advised by your caregiver.  Get the Human papilloma virus (HPV) vaccine.  Do not smoke.  Tell your caregiver if you have a family history of cancer of the cervix.  Do not have sexual intercourse before age 47.  Practice safe sex:  Use condoms.  Have one sex partner.  Make sure you are the only sex partner of your sex partner. SEEK MEDICAL CARE IF:   You have abnormal vaginal bleeding.  You have abnormal vaginal discharge.  You have vaginal bleeding after sexual intercourse, douching, or using tampons.  You develop vaginal bleeding after menopause.  You have pain with sexual intercourse.  You have unexplained weight loss.  You have unexplained tiredness.  You feel pressure with urination and/or with a bowel movement. SEEK IMMEDIATE MEDICAL CARE IF:   You have heavy vaginal bleeding, with or without clots.  You cannot urinate, or you have  blood in your urine.  You have blood or pressure with a bowel movement.  You develop severe back, stomach, or pelvic pain.  You develop an unexplained temperature of 100 F (37.9 C), or higher. Document Released: 12/11/2005 Document Revised: 03/04/2012 Document Reviewed: 10/20/2009 [employer] Patient Information 2013  [employer]. Cervical Dysplasia Cervical dysplasia is a condition in which a woman has abnormal changes in the cells of her cervix. The cervix is the opening to the uterus (womb) between the vagina and the uterus. These changes are called cervical dysplasia and may be the first signs of cervical cancer. These cells can be taken from the cervix during a Pap test and then looked at under a microscope. With early detection, treatment, and close follow-up care, nearly all cervical dysplasia can be cured. If untreated, the mild to moderate stages of dysplasia often grow more severe.  RISK FACTORS  The following increase the risk for cervical dysplasia.  Having had a sexually transmitted disease, including:  Chlamydia.  Human papilloma virus (HPV).  Becoming sexually active before age 64.  Having had more than 1 sexual partner.  Not using protection, such as condoms, during sexual intercourse, especially with new sexual partners.  Having had cancer of the vagina or vulva.  Having a sexual partner whose previous partner had cancer of the cervix or cervical dysplasia.  Having a sexual partner who has or has had cancer of the penis.  Having a weakened immune system (HIV, organ transplant).  Being the daughter of a woman who took DES (diethylstilbestrol) during pregnancy.  A history of cervical cancer in a woman's sister or mother.  Smoking.  Having had an abnormal Pap test in the past. SYMPTOMS  There are usually no symptoms. If there are symptoms, they may be vague such as:  Abnormal vaginal discharge.  Bleeding between periods or following  intercourse.  Bleeding during menopause.  Pain on intercourse (dyspareunia). DIAGNOSIS   The Pap test is the best way of detecting abnormalities of the cervix.  Biopsy (removing a piece of tissue to look at under the microscope) of the cervix when the Pap test is abnormal or when the Pap test is normal, but the cervix looks abnormal. TREATMENT  Catching and treating the changes early with Pap tests can prevent cervical cancer.  Cryotherapy freezes the abnormal cells with a steel tip instrument.  A laser can be used to remove the abnormal cells.  Loop electrocautery excision procedure (LEEP). This procedure uses a heated electrical loop to remove a cone-like portion of the cervix, including the cervical canal.  For more serious cases of cervical dysplasia, the abnormal tissue may be removed surgically by:  A cone biopsy (by cold knife, laser or LEEP). A procedure in which a portion of the center of the cervix with the cervical canal is removed.  The uterus and cervix are removed (hysterectomy). Your caregiver will advise you regarding the need and timing of Pap tests in your follow-up. Women who have been treated for dysplasia should be closely followed with pelvic exams and Pap tests. During the first year following treatment of cervical dysplasia, Pap tests should be done every 3 to 4 months. In the second year, the schedule is every 6 months, or as recommended by your caregiver. See your caregiver for new or worsening problems. HOME CARE INSTRUCTIONS   Follow the instructions and recommendations of your caregiver regarding medicines and follow-up appointments.  Only take over-the-counter or prescription medicines for pain or discomfort as directed by your caregiver.  Cramping and pelvic discomfort may follow cryotherapy. It is not abnormal to have watery discharge for several weeks after.  Laser, cone surgery, cryotherapy or LEEP can cause a bad smelling vaginal discharge. It may  also cause vaginal bleeding for a couple weeks following the  procedure. The discharge may be black from the paste used to control bleeding from the cone site. This is normal.  Do not use tampons, have sexual intercourse or douche until your caregiver says it is okay. SEEK MEDICAL CARE IF:   You develop genital warts.  You need a prescription for pain medicine following your treatment. SEEK IMMEDIATE MEDICAL CARE IF:   Your bleeding is heavier than a normal menstrual period.  You develop bright red bleeding, especially if you have blood clots.  You have a fever.  You have increasing cramps or pain not relieved with medicine.  You are lightheaded, unusually weak, or have fainting spells.  You have abnormal vaginal discharge.  You develop abdominal pain. PREVENTION   The surest way to prevent cervical dysplasia is to abstain from sexual intercourse.  Practice safe sex, use condoms and have only one sex partner who does not have other sex partners.  A Pap test is done to screen for cervical cancer.  The first Pap test should be done at age 46.  Between ages 16 and 5, Pap tests are repeated every 2 years.  Beginning at age 23, you are advised to have a Pap test every 3 years as long as your past 3 Pap tests have been normal.  Some women have medical problems that increase the chance of getting cervical cancer. Talk to your caregiver about these problems. It is especially important to talk to your caregiver if a new problem develops soon after your last Pap test. In these cases, your caregiver may recommend more frequent screening and Pap tests.  The above recommendations are the same for women who have or have not gotten the vaccine for HPV (Human Papillomavirus).  If you had a hysterectomy for a problem that was not a cancer or a condition that could lead to cancer, then you no longer need Pap tests. However, even if you no longer need a Pap test, a regular exam is a good idea  to make sure no other problems are starting.   If you are between ages 41 and 27, and you have had normal Pap tests going back 10 years, you no longer need Pap tests. However, even if you no longer need a Pap test, a regular exam is a good idea to make sure no other problems are starting.   If you have had past treatment for cervical cancer or a condition that could lead to cancer, you need Pap tests and screening for cancer for at least 20 years after your treatment.  If Pap tests have been discontinued, risk factors (such as a new sexual partner) need to be re-assessed to determine if screening should be resumed.  Some women may need screenings more often if they are at high risk for cervical cancer.  Your caregiver may do additional tests including:  Colposcopy. A procedure in which a special microscope magnifies the cells and allows the provider to closely examine the cervix, vagina, and vulva.  Biopsy. A small tissue sample is taken from the cervix, vagina or vulva. This is generally done in your caregivers office.  A cone biopsy (cold knife or laser). A large tissue sample is taken from the cervix. This procedure is usually done in an operating room under a general anesthetic. The cone often removes all abnormal tissue and so may also complete the treatment.  LEEP, also removing a circular portion of the cervix and is done in a doctors office under a local anesthetic.  Now there is a vaccine, Gardasil, that was developed to prevent the HPV'S that can cause cancer of the cervix and genital warts. It is recommended for females ages 26 to 75. It should not be given to pregnant women until more is known about its effects on the fetus. Not all cancers of the cervix are caused by the HPV. Routine gynecology exams and Pap tests should continue as recommended by your caregiver. Document Released: 12/11/2005 Document Revised: 03/04/2012 Document Reviewed: 12/02/2008 [facility] Patient  Information 2013 [place], [place].

## 2013-02-14 NOTE — Progress Notes (Signed)
Patient ID: Alison Mcdonald, female   DOB: 03-May-1981, 32 y.o.   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  She had a colpo at the time which showed no dysplasia.  She is s/p an SVD in Jan 2014.  Her baby subsequently expired 1 month previously due to congenital heart and pulmonary defects. She has had no further eval since that time. I do not see where pt had an endo bx.  Patient given informed consent, signed copy in the chart, time out was performed.  Placed in lithotomy position. Cervix viewed with speculum and colposcope after application of acetic acid.   Colposcopy adequate?  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  no Biopsies?yes x3 in endocervix x1 on ectocervix ECC?no  Patient was given post procedure instructions.  She will return in 2 weeks for results. She was encouraged to stop smoking!!!!  Alison Mcdonald, M.D., [facility]

## 2013-02-14 NOTE — Addendum Note (Signed)
Addended by: Kathee Delton on: 02/14/2013 11:39 AM   Modules accepted: Orders

## 2013-02-19 ENCOUNTER — Telehealth: Payer: Self-pay | Admitting: Obstetrics and Gynecology

## 2013-02-19 NOTE — Telephone Encounter (Signed)
Lab called for preliminary result of pap (high grade). Just want to let you know that we kept the "Result" appointment on 03/03/13; patient will also watch video of leep at this time. Appointment for Leep also already made and its on 03/10/13 @1415 .

## 2013-03-03 ENCOUNTER — Telehealth: Payer: Self-pay | Admitting: *Deleted

## 2013-03-03 ENCOUNTER — Ambulatory Visit: Payer: Medicaid Other | Admitting: Obstetrics & Gynecology

## 2013-03-03 NOTE — Telephone Encounter (Signed)
Pt left message requesting test results from her last clinic visit.

## 2013-03-05 ENCOUNTER — Telehealth: Payer: Self-pay | Admitting: General Practice

## 2013-03-05 NOTE — Telephone Encounter (Signed)
Called patient back and informed her that I received her message requesting results. Asked patient if she was wanting the colposcopy results and she stated yes. I told her the results did come back abnormal which doesn't mean that anything emergent needs to be done but that with colposcopies we always have patient's come back in to discuss the results with a physician so they can go over things in detail and discuss recommendations. Told patient her appt was scheduled for 3/19 @ 12:45 with Dr Erin Fulling. Patient verbalized understanding of everything and had no further questions

## 2013-03-05 NOTE — Telephone Encounter (Signed)
Patient called and stated she called on Monday requesting test results and would like a call back.

## 2013-03-10 ENCOUNTER — Ambulatory Visit: Payer: Medicaid Other | Admitting: Obstetrics and Gynecology

## 2013-03-12 ENCOUNTER — Ambulatory Visit: Payer: Medicaid Other | Admitting: Obstetrics & Gynecology

## 2013-03-19 NOTE — Telephone Encounter (Signed)
Opened in error

## 2013-05-27 ENCOUNTER — Inpatient Hospital Stay (HOSPITAL_COMMUNITY): Payer: Medicaid Other

## 2013-05-27 ENCOUNTER — Inpatient Hospital Stay (HOSPITAL_COMMUNITY)
Admission: AD | Admit: 2013-05-27 | Discharge: 2013-05-27 | Disposition: A | Discharge status: Home / Self Care | Payer: Medicaid Other | Source: Ambulatory Visit | Attending: Obstetrics & Gynecology | Admitting: Obstetrics & Gynecology

## 2013-05-27 ENCOUNTER — Encounter (HOSPITAL_COMMUNITY): Payer: Self-pay | Admitting: *Deleted

## 2013-05-27 DIAGNOSIS — O30109 Triplet pregnancy, unspecified number of placenta and unspecified number of amniotic sacs, unspecified trimester: Secondary | ICD-10-CM | POA: Insufficient documentation

## 2013-05-27 DIAGNOSIS — O209 Hemorrhage in early pregnancy, unspecified: Secondary | ICD-10-CM | POA: Insufficient documentation

## 2013-05-27 DIAGNOSIS — M545 Low back pain, unspecified: Secondary | ICD-10-CM | POA: Insufficient documentation

## 2013-05-27 DIAGNOSIS — O418X10 Other specified disorders of amniotic fluid and membranes, first trimester, not applicable or unspecified: Secondary | ICD-10-CM

## 2013-05-27 LAB — CBC
HCT: 37.7 % (ref 36.0–46.0)
Hemoglobin: 12.4 g/dL (ref 12.0–15.0)
MCH: 26.8 pg (ref 26.0–34.0)
MCHC: 32.9 g/dL (ref 30.0–36.0)

## 2013-05-27 LAB — WET PREP, GENITAL
Trich, Wet Prep: NONE SEEN
Yeast Wet Prep HPF POC: NONE SEEN

## 2013-05-27 LAB — URINALYSIS, ROUTINE W REFLEX MICROSCOPIC
Bilirubin Urine: NEGATIVE
Glucose, UA: NEGATIVE mg/dL
Nitrite: NEGATIVE
Specific Gravity, Urine: >1.03 — ABNORMAL HIGH (ref 1.005–1.030)
pH: 6 (ref 5.0–8.0)

## 2013-05-27 NOTE — MAU Note (Signed)
Light bleeding noted yesterday morning, continued throughout the day.  Mild cramping, lower back pain.  Continues to have light pink bleeding today.  + HPT on 5/21.

## 2013-05-27 NOTE — MAU Note (Signed)
Records from today faxed to Carilyn Goodpasture with [facility] per pt request.  Release of information signed.

## 2013-05-27 NOTE — MAU Provider Note (Signed)
History     CSN: [id number]  Arrival date and time: 05/27/13 7829   First Provider Initiated Contact with Patient 05/27/13 0901      Chief Complaint  Patient presents with  . Vaginal Bleeding   HPI Ms. Alison Mcdonald is a 32 y.o. [id number] at [redacted]w[redacted]d who presents to MAU today with bleeding and lower abdominal cramping. LMP 04/07/13. The patient states only a small amount of bleeding when wiping. Lower back pain and mild abdominal cramping off and on. She also has occasional nausea without vomiting and dizziness. She denies fever, UTI symptoms, diarrhea or constipation.   OB History   Grav Para Term Preterm Abortions TAB SAB Ect Mult Living   11 1 1  9 1 8   1       Past Medical History  Diagnosis Date  . Anemia   . 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 FH: multiple miscarriages or stillbirths   . Hypoxia     Past Surgical History  Procedure Laterality Date  . Dilation and curettage of uterus    . Colposcopy  12/2011    Family History  Problem Relation Age of Onset  . Heart disease Mother     had heart attack with stents placed  . Hypertension Mother   . Deep vein thrombosis Mother   . Hypertension Father   . Diabetes Father   . Asthma Brother     History  Substance Use Topics  . Smoking status: Former Smoker -- 0.30 packs/day    Types: Cigarettes    Quit date: 05/20/2012  . Smokeless tobacco: Never Used  . Alcohol Use: No    Allergies:  Allergies  Allergen Reactions  . Codeine Hives  . Latex Hives    Prescriptions prior to admission  Medication Sig Dispense Refill  . Prenatal Vit-Fe Fumarate-FA (PRENATAL MULTIVITAMIN) TABS Take 1 tablet by mouth daily at 12 noon.        Review of Systems  Constitutional: Negative for fever and malaise/fatigue.  Gastrointestinal: Positive for nausea and abdominal pain. Negative for vomiting, diarrhea and constipation.  Genitourinary: Negative for dysuria, urgency and frequency.       + vaginal bleeding Neg -  vaginal discharge  Neurological: Positive for dizziness. Negative for loss of consciousness.   Physical Exam   Blood pressure 120/51, pulse 76, temperature 97.4 F (36.3 C), temperature source Oral, resp. rate 18, height 5\' 6"  (1.676 m), weight 270 lb (122.471 kg), last menstrual period 04/07/2013.  Physical Exam  Constitutional: She is oriented to person, place, and time. She appears well-developed and well-nourished. No distress.  HENT:  Head: Normocephalic and atraumatic.  Cardiovascular: Normal rate, regular rhythm and normal heart sounds.   Respiratory: Effort normal and breath sounds normal. No respiratory distress.  GI: Soft. Bowel sounds are normal. She exhibits no distension and no mass. There is no tenderness. There is no rebound and no guarding.  Genitourinary: Uterus is enlarged (appropriate for GA, exam limited by maternal body habitus). Uterus is not tender. Cervix exhibits no motion tenderness, no discharge and no friability. Right adnexum displays no mass and no tenderness. Left adnexum displays no mass and no tenderness. There is bleeding (scant bleeding noted) around the vagina. Vaginal discharge (scant mucus discharge) found.  Neurological: She is alert and oriented to person, place, and time.  Skin: Skin is warm and dry. No erythema.  Psychiatric: She has a normal mood and affect.   Results for orders placed  during the hospital encounter of 05/27/13 (from the past 24 hour(s))  URINALYSIS, ROUTINE W REFLEX MICROSCOPIC     Status: Abnormal   Collection Time    05/27/13  8:30 AM      Result Value Range   Color, Urine YELLOW  YELLOW   APPearance CLEAR  CLEAR   Specific Gravity, Urine >1.030 (*) 1.005 - 1.030   pH 6.0  5.0 - 8.0   Glucose, UA NEGATIVE  NEGATIVE mg/dL   Hgb urine dipstick LARGE (*) NEGATIVE   Bilirubin Urine NEGATIVE  NEGATIVE   Ketones, ur NEGATIVE  NEGATIVE mg/dL   Protein, ur NEGATIVE  NEGATIVE mg/dL   Urobilinogen, UA 0.2  0.0 - 1.0 mg/dL    Nitrite NEGATIVE  NEGATIVE   Leukocytes, UA TRACE (*) NEGATIVE  URINE MICROSCOPIC-ADD ON     Status: Abnormal   Collection Time    05/27/13  8:30 AM      Result Value Range   Squamous Epithelial / LPF MANY (*) RARE   WBC, UA 3-6  <3 WBC/hpf   Bacteria, UA FEW (*) RARE  POCT PREGNANCY, URINE     Status: Abnormal   Collection Time    05/27/13  8:41 AM      Result Value Range   Preg Test, Ur POSITIVE (*) NEGATIVE  CBC     Status: Abnormal   Collection Time    05/27/13  9:04 AM      Result Value Range   WBC 10.4  4.0 - 10.5 K/uL   RBC 4.62  3.87 - 5.11 MIL/uL   Hemoglobin 12.4  12.0 - 15.0 g/dL   HCT 47.8  29.5 - 62.1 %   MCV 81.6  78.0 - 100.0 fL   MCH 26.8  26.0 - 34.0 pg   MCHC 32.9  30.0 - 36.0 g/dL   RDW 30.8 (*) 65.7 - 84.6 %   Platelets 250  150 - 400 K/uL  HCG, QUANTITATIVE, PREGNANCY     Status: Abnormal   Collection Time    05/27/13  9:04 AM      Result Value Range   hCG, Beta Chain, Quant, S 22905 (*) <5 mIU/mL  WET PREP, GENITAL     Status: Abnormal   Collection Time    05/27/13  9:10 AM      Result Value Range   Yeast Wet Prep HPF POC NONE SEEN  NONE SEEN   Trich, Wet Prep NONE SEEN  NONE SEEN   Clue Cells Wet Prep HPF POC FEW (*) NONE SEEN   WBC, Wet Prep HPF POC FEW (*) NONE SEEN      MAU Course  Procedures None  MDM UA, UPT, Wet prep, GC/Chlamydia, CBC, quant hCG and [place] today O+ blood type from previous Epic encounter  Assessment and Plan  A: Triplet IUP at 6w 1d Bleeding in early pregnancy Small subchorionic hemorrhage  P: Discharge home Patient plans to start prenatal care at [facility] Patient may return to MAU as needed  Freddi Starr, PA-C  05/27/2013, 11:39 AM

## 2013-05-28 LAB — URINE CULTURE: Colony Count: 55000

## 2013-05-28 LAB — GC/CHLAMYDIA PROBE AMP: CT Probe RNA: NEGATIVE

## 2013-06-02 ENCOUNTER — Inpatient Hospital Stay (HOSPITAL_COMMUNITY): Payer: Medicaid Other

## 2013-06-02 ENCOUNTER — Encounter (HOSPITAL_COMMUNITY): Payer: Self-pay | Admitting: *Deleted

## 2013-06-02 ENCOUNTER — Inpatient Hospital Stay (HOSPITAL_COMMUNITY)
Admission: AD | Admit: 2013-06-02 | Discharge: 2013-06-02 | Disposition: A | Discharge status: Home / Self Care | Payer: Medicaid Other | Source: Ambulatory Visit | Attending: Obstetrics & Gynecology | Admitting: Obstetrics & Gynecology

## 2013-06-02 DIAGNOSIS — O30109 Triplet pregnancy, unspecified number of placenta and unspecified number of amniotic sacs, unspecified trimester: Secondary | ICD-10-CM | POA: Insufficient documentation

## 2013-06-02 DIAGNOSIS — O209 Hemorrhage in early pregnancy, unspecified: Secondary | ICD-10-CM | POA: Insufficient documentation

## 2013-06-02 DIAGNOSIS — O30009 Twin pregnancy, unspecified number of placenta and unspecified number of amniotic sacs, unspecified trimester: Secondary | ICD-10-CM

## 2013-06-02 DIAGNOSIS — R109 Unspecified abdominal pain: Secondary | ICD-10-CM | POA: Insufficient documentation

## 2013-06-02 DIAGNOSIS — O30001 Twin pregnancy, unspecified number of placenta and unspecified number of amniotic sacs, first trimester: Secondary | ICD-10-CM

## 2013-06-02 DIAGNOSIS — O200 Threatened abortion: Secondary | ICD-10-CM

## 2013-06-02 LAB — URINALYSIS, ROUTINE W REFLEX MICROSCOPIC
Bilirubin Urine: NEGATIVE
Ketones, ur: NEGATIVE mg/dL
Protein, ur: NEGATIVE mg/dL
Urobilinogen, UA: 0.2 mg/dL (ref 0.0–1.0)

## 2013-06-02 LAB — CBC
MCH: 26.5 pg (ref 26.0–34.0)
MCV: 80.1 fL (ref 78.0–100.0)
Platelets: 256 10*3/uL (ref 150–400)
RDW: 16.6 % — ABNORMAL HIGH (ref 11.5–15.5)

## 2013-06-02 NOTE — MAU Note (Signed)
Patient states that when she woke up this morning and when she wiped had blood on the tissue, no further bleeding. Patient states she is having abdominal cramping.

## 2013-06-02 NOTE — MAU Provider Note (Signed)
  History     CSN: [id number]  Arrival date and time: 06/02/13 0707   None     Chief Complaint  Patient presents with  . Abdominal Pain   HPI  Alison Mcdonald is a 32 y.o. [id number] who is here with a triplet pregnancy. She had some spotting last week that has since gone away. However, today she woke up with some new bleeding and in addition she had cramping and lower back pain. She has not taken anything for the pain at home. She is planning on transferring care to [facility], but has not established care at this time.   Past Medical History  Diagnosis Date  . Anemia   . 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 FH: multiple miscarriages or stillbirths   . Hypoxia     Past Surgical History  Procedure Laterality Date  . Dilation and curettage of uterus    . Colposcopy  12/2011    Family History  Problem Relation Age of Onset  . Heart disease Mother     had heart attack with stents placed  . Hypertension Mother   . Deep vein thrombosis Mother   . Hypertension Father   . Diabetes Father   . Asthma Brother     History  Substance Use Topics  . Smoking status: Former Smoker -- 0.30 packs/day    Types: Cigarettes    Quit date: 05/20/2012  . Smokeless tobacco: Never Used  . Alcohol Use: No    Allergies:  Allergies  Allergen Reactions  . Codeine Hives  . Latex Hives    Prescriptions prior to admission  Medication Sig Dispense Refill  . Prenatal Vit-Fe Fumarate-FA (PRENATAL MULTIVITAMIN) TABS Take 1 tablet by mouth daily at 12 noon.        Review of Systems  Constitutional: Negative for fever and chills.  Eyes: Negative for blurred vision.  Respiratory: Negative for shortness of breath.   Cardiovascular: Negative for chest pain.  Gastrointestinal: Positive for abdominal pain. Negative for nausea, vomiting, diarrhea and constipation.  Genitourinary: Negative for dysuria, urgency and frequency.  Musculoskeletal: Negative for myalgias.  Neurological: Negative for  headaches.   Physical Exam   Blood pressure 125/55, pulse 79, temperature 97.7 F (36.5 C), temperature source Oral, resp. rate 18, height 5\' 3"  (1.6 m), weight 124.286 kg (274 lb), last menstrual period 04/07/2013.  Physical Exam  Nursing note and vitals reviewed. Constitutional: She is oriented to person, place, and time. She appears well-developed and well-nourished. No distress.  Cardiovascular: Normal rate.   Respiratory: Effort normal.  GI: Soft. There is no tenderness.  Neurological: She is alert and oriented to person, place, and time.  Skin: Skin is warm and dry.  Psychiatric: She has a normal mood and affect.    MAU Course  Procedures    Assessment and Plan  0800: Care turned over to [facility], CNM  Tawnya Crook 06/02/2013, 7:46 AM

## 2013-06-03 LAB — URINE CULTURE: Colony Count: 75000

## 2013-06-03 NOTE — MAU Provider Note (Signed)
Awaiting full AP report.  [id number]

## 2013-06-07 ENCOUNTER — Encounter (HOSPITAL_COMMUNITY): Payer: Self-pay

## 2013-06-07 ENCOUNTER — Inpatient Hospital Stay (HOSPITAL_COMMUNITY)
Admission: AD | Admit: 2013-06-07 | Discharge: 2013-06-07 | Disposition: A | Discharge status: Home / Self Care | Payer: Medicaid Other | Source: Ambulatory Visit | Attending: Obstetrics and Gynecology | Admitting: Obstetrics and Gynecology

## 2013-06-07 ENCOUNTER — Inpatient Hospital Stay (HOSPITAL_COMMUNITY): Payer: Medicaid Other

## 2013-06-07 DIAGNOSIS — O4690 Antepartum hemorrhage, unspecified, unspecified trimester: Secondary | ICD-10-CM

## 2013-06-07 DIAGNOSIS — O209 Hemorrhage in early pregnancy, unspecified: Secondary | ICD-10-CM | POA: Insufficient documentation

## 2013-06-07 DIAGNOSIS — O30009 Twin pregnancy, unspecified number of placenta and unspecified number of amniotic sacs, unspecified trimester: Secondary | ICD-10-CM | POA: Insufficient documentation

## 2013-06-07 DIAGNOSIS — O30039 Twin pregnancy, monochorionic/diamniotic, unspecified trimester: Secondary | ICD-10-CM

## 2013-06-07 MED ORDER — ACETAMINOPHEN 500 MG PO TABS
1000.0000 mg | ORAL_TABLET | Freq: Once | ORAL | Status: AC
  Administered 2013-06-07: 1000 mg via ORAL
  Filled 2013-06-07: qty 2

## 2013-06-07 NOTE — Discharge Instructions (Signed)
Multiple Pregnancy A multiple pregnancy is when a woman is pregnant with two or more fetuses. Multiple pregnancies occur in about 3% of all births. The more babies in a pregnancy, the greater the risks of problems to the babies and mother. This includes death. Since the use of Assisted Reproductive Technology (ART) and medications that can induce ovulation, multiple fetal gestation has increased.  RISKS TO THE MOTHER  Preeclampsia and eclampsia.  Postpartum bleeding (hemorrhage).  Kidney infection (pyelonephritis).  Develop anemia.  Develop diabetes.  Liver complications.  A blood clot blocks the artery, or branch of the artery leading to the lungs (pulmonary embolism).  Blood clots in the leg.  Placental separation.  Higher rate of Cesarean Section deliveries.  Women over 70 years old have a higher rate of Downs Syndrome babies. RISKS TO THE BABY  Preterm labor with a premature baby.  Very low birth weight babies that are less than 3 pounds, especially with triplets or mores.  Premature rupture of the membranes.  Twin to twin blood transfusion with one baby anemic and the other baby with too much blood in its system. There may also be heart failure.  With triplets or more, one of the babies is at high risk for cerebral palsy or other neurologic problem.  There is a higher incidence of fetal death. CARE OF MOTHERS WITH MULTIPLE FETAL GESTATION Multiple pregnancies need more care and special prenatal care.  You will see your caregiver more often.  You will have more tests including ultrasounds, nonstress tests and blood tests.  You will have special tests done called amniocentesis and a biophysical profile.  You may be hospitalized more often during the pregnancy.  You will be encouraged to eat a balanced and healthy diet with vitamin and mineral supplements as directed.  You will be asked to get more rest and sleep to keep up your energy.  You will be asked to  restrict your daily activities, exercise, work, household chores and sexual activity.  If you have preterm labor with small babies, you will be given a steroid injection to help the babies lungs mature and do better when born.  The delivery may have to be by Cesarean delivery, especially if there are triplets or more.  The delivery should be in a hospital with an intensive care nursery and Neonatologists (pediatrician for high risk babies) to care for the newborn babies. HOME CARE INSTRUCTIONS   Follow the caregiver's recommendations regarding office visits, tests for you and the babies, diet, rest and medications.  Avoid a large amount of physical activity.  Arrange to have help after the babies are born and when you go home from the hospital.  Take classes on how to care for multiple babies before you deliver them. SEEK IMMEDIATE MEDICAL CARE IF:   You develop a temperature of 102 F (38.9 C) or higher.  You are leaking fluid from the vagina.  You develop vaginal bleeding.  You develop uterine contractions.  You develop a severe headache, severe upper abdominal pain, visual problems or excessive swelling of your face, hands and feet.  You develop severe back pain or leg pain.  You develop severe tiredness.  You develop chest pain.  You have shortness of breath, fall down or pass out. Document Released: 09/19/2008 Document Revised: 03/04/2012 Document Reviewed: 09/19/2008 [facility] Patient Information 2014 [place], [place].   Vaginal Bleeding During Pregnancy A small amount of bleeding from the vagina can happen anytime during pregnancy. It usually stops on its own.  However, some bleeding can be serious. Be sure to tell your doctor about all vaginal bleeding. HOME CARE  Get plenty of rest and sleep.  Stay in bed and only get up to go to the bathroom as told by your doctor.  Write down the number of pads you use each day. Note how soaked they are.  Do not use tampons.  Do not clean the vagina with a stream of water (douche).  Do not have sex (intercourse) or put anything into your vagina. Have this approved by your doctor.  Save any tissue that comes from your vagina. Show it to your doctor.  Only take medicine as told by your doctor.  Follow your doctor's advice about lifting, driving, and physical activity. GET HELP RIGHT AWAY IF:   You feel your baby move less or not at all.  You pass out (faint) while going to the bathroom.  You have more bleeding.  You start to have contractions.  You have severe cramps in your stomach, back, or belly (abdomen).  You are leaking fluid or have a gush of fluid from your vagina.  You become lightheaded or weak.  You have chills.  You have clumps of tissue or blood clots coming from your vagina.  You have a fever. MAKE SURE YOU:   Understand these instructions.  Will watch your condition.  Will get help right away if you are not doing well or get worse. Document Released: 09/19/2008 Document Revised: 11/27/2012 Document Reviewed: 09/30/2012 [facility] Patient Information 2014 [place], [place].

## 2013-06-07 NOTE — MAU Note (Signed)
Patient reports woke up around 11:30 went to bathroom saw bleeding, went back to bed stomach started hurting, went back to bathroom passed small clots and bleeding was dark red.

## 2013-06-07 NOTE — MAU Provider Note (Signed)
  History     CSN: [id number]  Arrival date and time: 06/07/13 0112   None     Chief Complaint  Patient presents with  . Vaginal Bleeding  . Abdominal Pain   HPI  Alison Mcdonald is a 32 y.o. [id number] at [redacted]w[redacted]d with MC/MA twins, and disappearing triplet. She was seen at [facility] on 06/06/13, and was told that her ultrasound was "fine, the babies have their own sacs, but share a placenta". She has another appointment there in 4 weeks, and then will go every 2 week from that point forward. At 2330 she started to have some bleeding and then started to have increased pain. When the pain became worse she started to pass small (dime sized clots), and the bleeding became darker.   Past Medical History  Diagnosis Date  . Anemia   . 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 FH: multiple miscarriages or stillbirths   . Hypoxia     Past Surgical History  Procedure Laterality Date  . Dilation and curettage of uterus    . Colposcopy  12/2011    Family History  Problem Relation Age of Onset  . Heart disease Mother     had heart attack with stents placed  . Hypertension Mother   . Deep vein thrombosis Mother   . Hypertension Father   . Diabetes Father   . Asthma Brother     History  Substance Use Topics  . Smoking status: Former Smoker -- 0.30 packs/day    Types: Cigarettes    Quit date: 05/20/2012  . Smokeless tobacco: Never Used  . Alcohol Use: No    Allergies:  Allergies  Allergen Reactions  . Codeine Hives  . Latex Hives    Prescriptions prior to admission  Medication Sig Dispense Refill  . Prenatal Vit-Fe Fumarate-FA (PRENATAL MULTIVITAMIN) TABS Take 1 tablet by mouth daily at 12 noon.        Review of Systems  Constitutional: Negative for fever.  Eyes: Negative for blurred vision.  Respiratory: Negative for shortness of breath.   Cardiovascular: Negative for chest pain.  Gastrointestinal: Positive for abdominal pain. Negative for nausea, vomiting, diarrhea and  constipation.  Genitourinary: Negative for dysuria, urgency and frequency.  Musculoskeletal: Negative for myalgias.  Neurological: Negative for dizziness and headaches.   Physical Exam   Blood pressure 117/58, pulse 88, temperature 98.5 F (36.9 C), temperature source Oral, resp. rate 18, last menstrual period 04/07/2013.  Physical Exam  Nursing note and vitals reviewed. Constitutional: She is oriented to person, place, and time. She appears well-developed and well-nourished. No distress.  Cardiovascular: Normal rate.   Respiratory: Effort normal.  GI: Soft. There is no tenderness.  Neurological: She is alert and oriented to person, place, and time.  Skin: Skin is warm and dry.  Psychiatric: She has a normal mood and affect.    MAU Course  Procedures   Will treat with tylenol here in MAU Assessment and Plan  -Bleeding in 1st trimester -Twin gestation, with disappearing triplet -MC, but not MA  Continue PNC as recommended by [facility] Danger signs reviewed Return to MAU as needed  Tawnya Crook 06/07/2013, 2:04 AM

## 2013-06-09 ENCOUNTER — Other Ambulatory Visit (HOSPITAL_COMMUNITY): Payer: Self-pay | Admitting: Advanced Practice Midwife

## 2013-06-09 DIAGNOSIS — O209 Hemorrhage in early pregnancy, unspecified: Secondary | ICD-10-CM

## 2013-06-10 ENCOUNTER — Other Ambulatory Visit (HOSPITAL_COMMUNITY): Payer: Self-pay | Admitting: Advanced Practice Midwife

## 2013-06-10 DIAGNOSIS — O209 Hemorrhage in early pregnancy, unspecified: Secondary | ICD-10-CM

## 2013-06-11 NOTE — MAU Provider Note (Signed)
Attestation of Attending Supervision of Advanced Practitioner: Evaluation and management procedures were performed by the PA/NP/CNM/OB Fellow under my supervision/collaboration. Chart reviewed and agree with management and plan.  Carlea Badour V 06/11/2013 7:02 AM

## 2013-08-27 ENCOUNTER — Emergency Department (HOSPITAL_COMMUNITY)
Admission: EM | Admit: 2013-08-27 | Discharge: 2013-08-27 | Disposition: A | Discharge status: Home / Self Care | Payer: Medicaid Other | Source: Home / Self Care | Attending: Emergency Medicine | Admitting: Emergency Medicine

## 2013-08-27 ENCOUNTER — Encounter (HOSPITAL_COMMUNITY): Payer: Self-pay | Admitting: *Deleted

## 2013-08-27 DIAGNOSIS — J029 Acute pharyngitis, unspecified: Secondary | ICD-10-CM

## 2013-08-27 NOTE — ED Provider Notes (Signed)
Chief Complaint:   Chief Complaint  Patient presents with  . Sore Throat    History of Present Illness:   Alison Mcdonald is a 32 year old female who has had a two-day history of sore throat on the left side with radiation toward her ear. This is worse when she swallows. She is [redacted] weeks pregnant. Denies fever, chills, headache, nasal congestion, rhinorrhea, swollen glands, cough, or GI symptoms.  Review of Systems:  Other than as noted above, the patient denies any of the following symptoms. Systemic:  No fever, chills, sweats, fatigue, myalgias, headache, or anorexia. Eye:  No redness, pain or drainage. ENT:  No earache, ear congestion, nasal congestion, sneezing, rhinorrhea, sinus pressure, sinus pain, or post nasal drip. Lungs:  No cough, sputum production, wheezing, shortness of breath, or chest pain. GI:  No abdominal pain, nausea, vomiting, or diarrhea. Skin:  No rash or itching.  PMFSH:  Past medical history, family history, social history, meds, allergies, and nurse's notes were reviewed.  There is no known exposure to strep or mono.  No prior history of step or mono.  The patient denies use of tobacco.   Physical Exam:   Vital signs:  BP 136/91  Pulse 106  Temp(Src) 98.9 F (37.2 C) (Oral)  Resp 20  SpO2 100%  LMP 04/07/2013 General:  Alert, in no distress. Eye:  No conjunctival injection or drainage. Lids were normal. ENT:  TMs and canals were normal, without erythema or inflammation.  Nasal mucosa was clear and uncongested, without drainage.  Mucous membranes were moist.  Exam of pharynx shows slight erythema, no exudate or ulceration.  There were no oral ulcerations or lesions. Neck:  Supple, no adenopathy, tenderness or mass. Lungs:  No respiratory distress.  Lungs were clear to auscultation, without wheezes, rales or rhonchi.  Breath sounds were clear and equal bilaterally.  Heart:  Regular rhythm, without gallops, murmers or rubs. Skin:  Clear, warm, and dry, without rash  or lesions.  Labs:   Results for orders placed during the hospital encounter of 08/27/13  POCT RAPID STREP A ([facility] URG CARE ONLY)      Result Value Range   Streptococcus, Group A Screen (Direct) NEGATIVE  NEGATIVE   Assessment:  The encounter diagnosis was Viral pharyngitis.  Suggested symptomatic treatment. No indication for antibiotics.  Plan:   1.  The following meds were prescribed:   Discharge Medication List as of 08/27/2013  2:27 PM     2.  The patient was instructed in symptomatic care including hot saline gargles, throat lozenges, infectious precautions, and need to trade out toothbrush. Handouts were given. Symptomatic treatment only, no indication for antibiotics. 3.  The patient was told to return if becoming worse in any way, if no better in 3 or 4 days, and given some red flag symptoms such as  Difficulty swallowing or breathing that would indicate earlier return. 4.  Follow up here if necessary.    Reuben Likes, MD 08/27/13 [phone]

## 2013-08-27 NOTE — ED Notes (Signed)
Pt   Is  4  And  1/2  Months  Pregnant      Pt  Reports  sorethroat  l  Side   Pain  When  She  Swallows     Reports  l  Earache  As  Well

## 2013-08-29 LAB — CULTURE, GROUP A STREP

## 2013-10-29 ENCOUNTER — Other Ambulatory Visit: Payer: Self-pay

## 2013-12-13 ENCOUNTER — Encounter (HOSPITAL_COMMUNITY): Payer: Self-pay

## 2013-12-13 ENCOUNTER — Inpatient Hospital Stay (HOSPITAL_COMMUNITY)
Admission: AD | Admit: 2013-12-13 | Discharge: 2013-12-13 | Disposition: A | Discharge status: Home / Self Care | Payer: Medicaid Other | Source: Ambulatory Visit | Attending: Family Medicine | Admitting: Family Medicine

## 2013-12-13 DIAGNOSIS — O909 Complication of the puerperium, unspecified: Secondary | ICD-10-CM | POA: Insufficient documentation

## 2013-12-13 DIAGNOSIS — Z4800 Encounter for change or removal of nonsurgical wound dressing: Secondary | ICD-10-CM

## 2013-12-13 NOTE — MAU Note (Signed)
Pt states here for wet to dry bandage change. Had C/S 11/30/2013, delivered twins, had wound vac placed 12/08/2013.

## 2013-12-13 NOTE — MAU Provider Note (Signed)
  History     CSN: [id number]  Arrival date and time: 12/13/13 1411   None     Chief Complaint  Patient presents with  . Wound Check   HPI Alison Mcdonald is a 32 y.o. [id number] at [redacted]w[redacted]d s/p c-section on 11/30/13 at [facility]. Pt was trying to be discharged on 12/03/13 when she was found to have an infection. Pt had a wound vac placed on 12/08/13. Pt had breakdown of seal and was sent by home care to have a wet to dry placed. Pt will have wound vac replaced by home healht.  OB History   Grav Para Term Preterm Abortions TAB SAB Ect Mult Living   11 1 1  9 1 8    0      Past Medical History  Diagnosis Date  . Anemia   . 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 FH: multiple miscarriages or stillbirths   . Hypoxia     Past Surgical History  Procedure Laterality Date  . Dilation and curettage of uterus    . Colposcopy  12/2011    Family History  Problem Relation Age of Onset  . Heart disease Mother     had heart attack with stents placed  . Hypertension Mother   . Deep vein thrombosis Mother   . Hypertension Father   . Diabetes Father   . Asthma Brother     History  Substance Use Topics  . Smoking status: Former Smoker -- 0.30 packs/day    Types: Cigarettes    Quit date: 05/20/2012  . Smokeless tobacco: Never Used  . Alcohol Use: No    Allergies:  Allergies  Allergen Reactions  . Codeine Hives  . Latex Hives    Prescriptions prior to admission  Medication Sig Dispense Refill  . aspirin 81 MG tablet Take 81 mg by mouth daily.      Marland Kitchen FOLIC ACID PO Take by mouth.      . Prenatal Vit-Fe Fumarate-FA (PRENATAL MULTIVITAMIN) TABS Take 1 tablet by mouth daily at 12 noon.        ROS Physical Exam   Blood pressure 123/73, pulse 76, temperature 98.5 F (36.9 C), temperature source Oral, resp. rate 18, height 5\' 5"  (1.651 m), weight 125.646 kg (277 lb), last menstrual period 11/30/2013, currently breastfeeding.  Physical Exam VSS NAD Foul odor from wound.  Wound not sealed. Dressing taking down. Wound bed intact. Peritoneum intact.   MAU Course  Procedures  MDM Wound dressing changed to wet to dry.  Assessment and Plan  Alison Mcdonald is a 32 y.o. [id number] at [redacted]w[redacted]d s/p wound dressing change. Discharged home with plan for home health to evaluate today for wound vac.  Tawana Scale 12/13/2013, 3:41 PM

## 2013-12-13 NOTE — MAU Provider Note (Signed)
Chart reviewed and agree with management and plan.  

## 2014-08-09 IMAGING — US US OB FOLLOW-UP
2 series · 12 of 28 positions shown · non-contrast
Comparison: none

[Series 1: us ob follow up · 3 of 22 slices shown (1 of 2)]
[im 4/22]
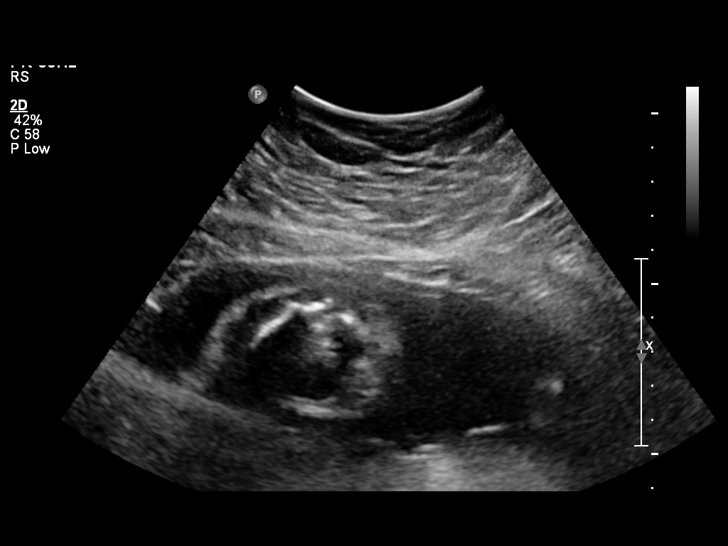
[im 10/22]
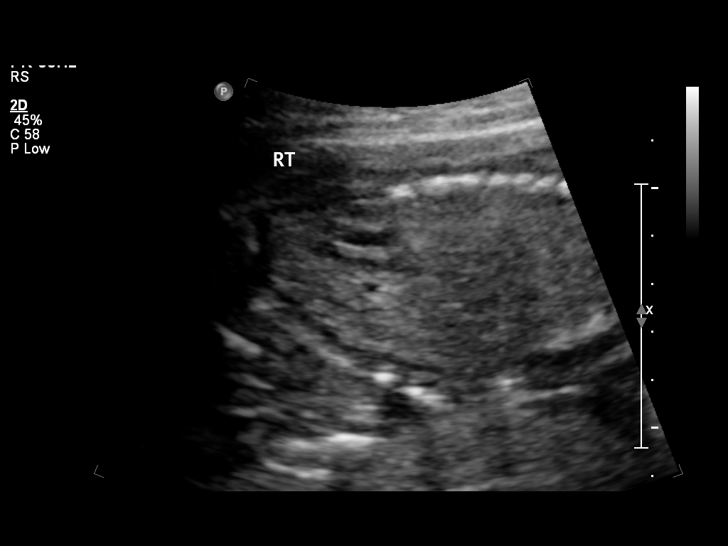
[im 16/22]
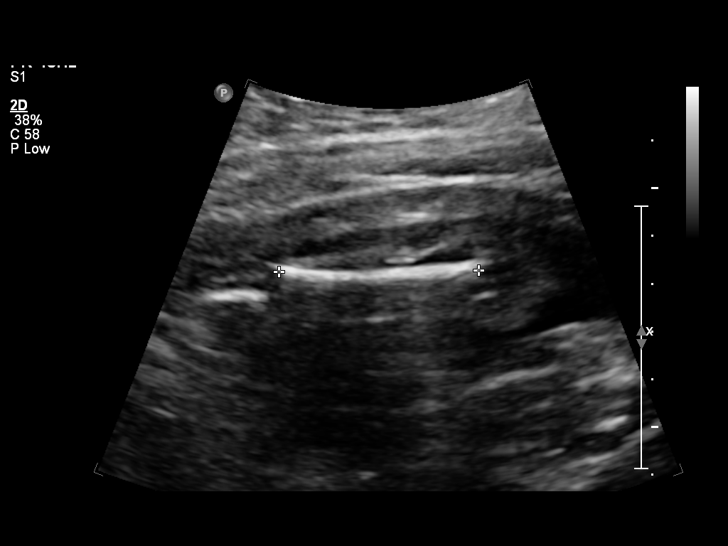

[Series 1: us ob follow up · 53 acquisitions, 9 frames shown (2 of 2)]
[im 1/53]
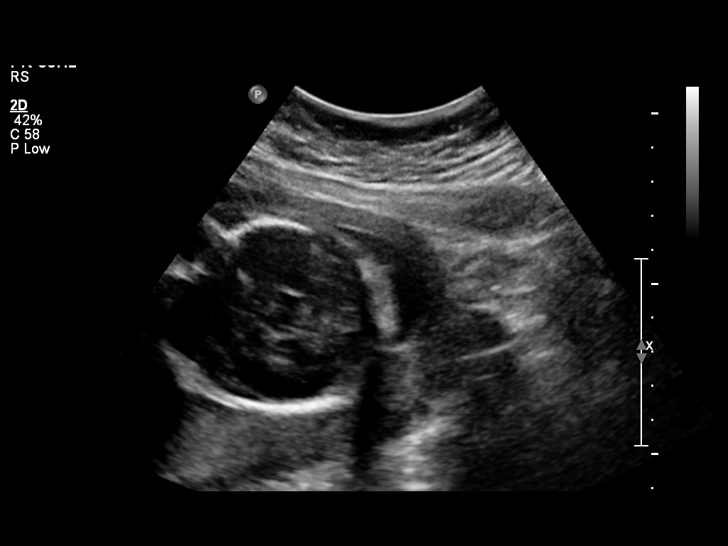
[im 6/53]
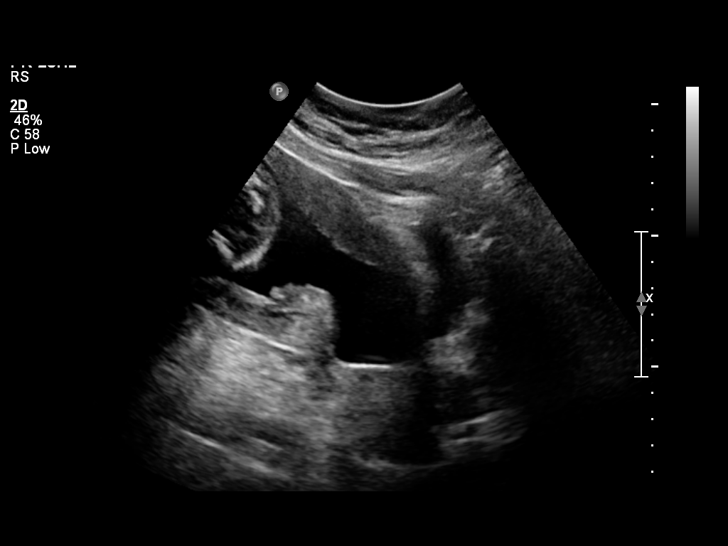
[im 11/53]
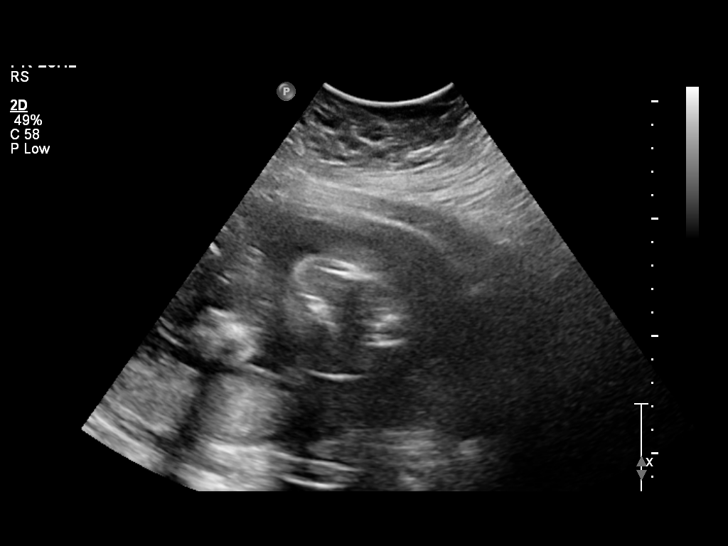
[im 20/53]
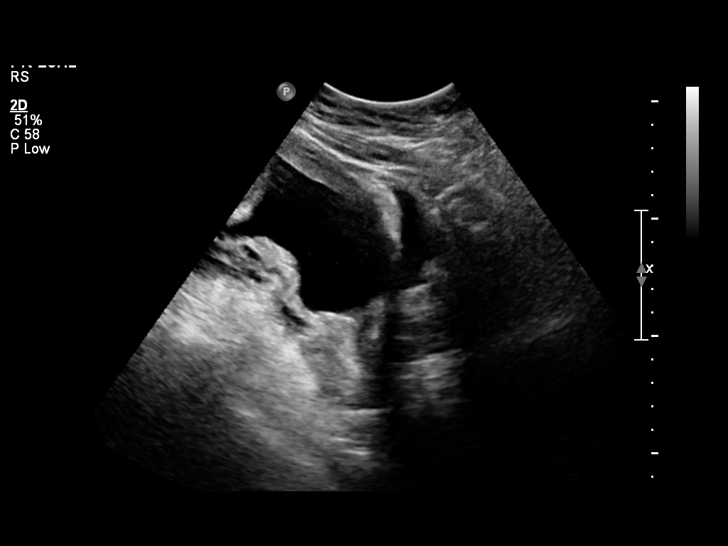
[im 25/53]
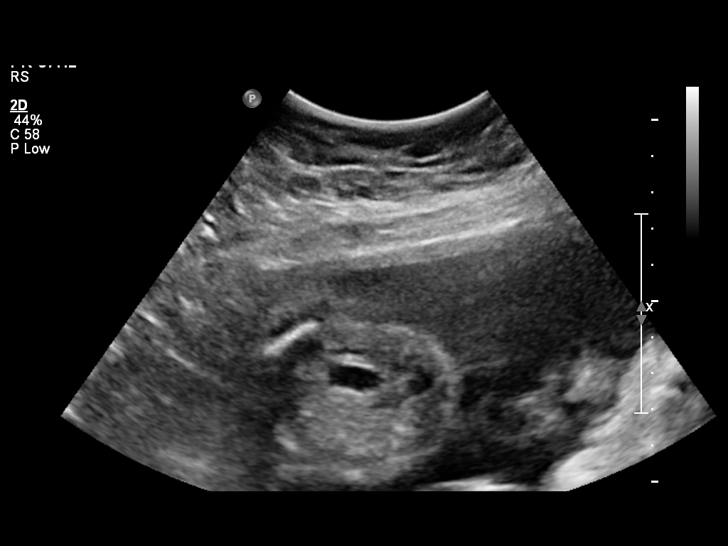
[im 31/53]
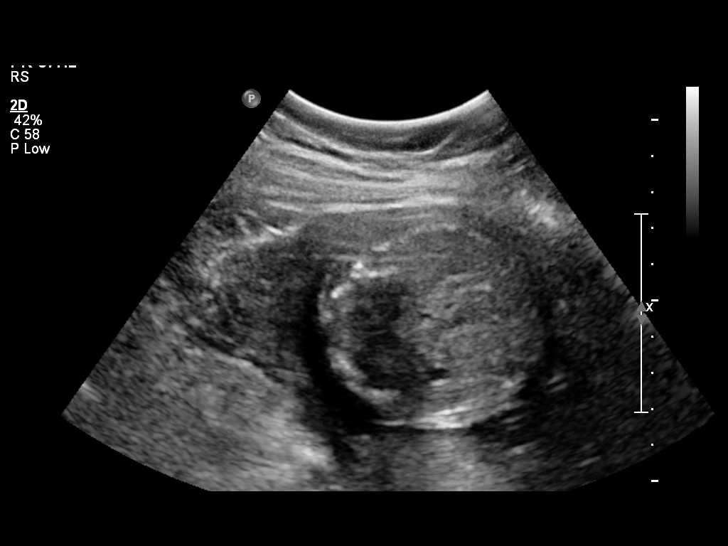
[im 39/53]
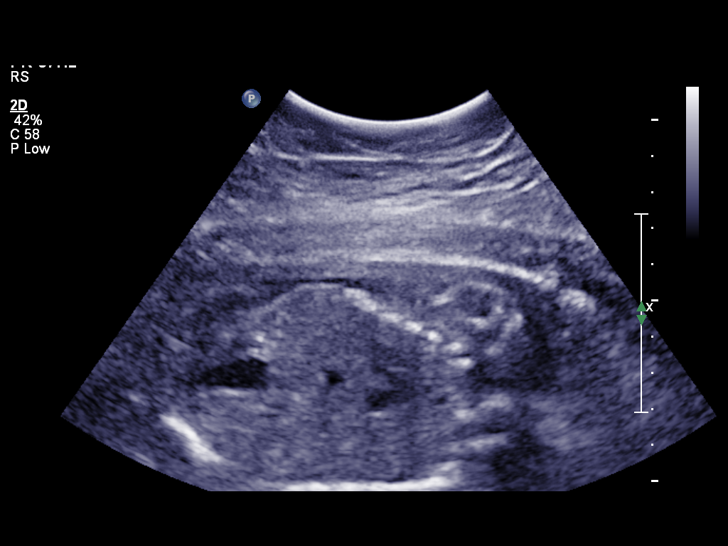
[im 44/53]
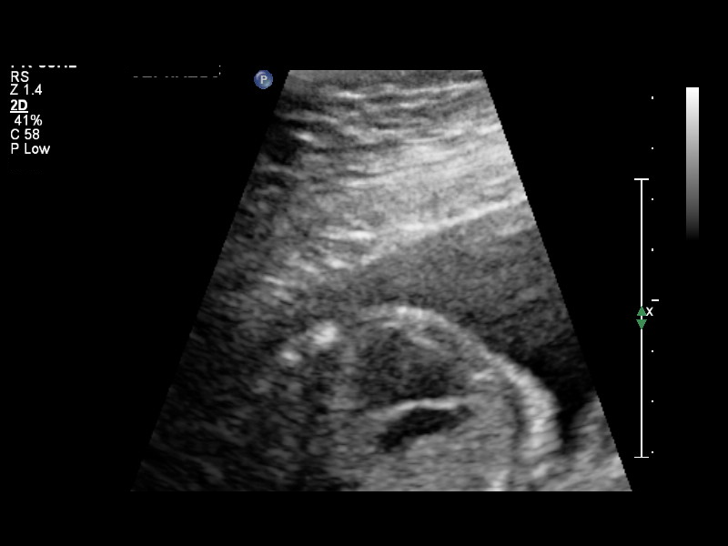
[im 50/53]
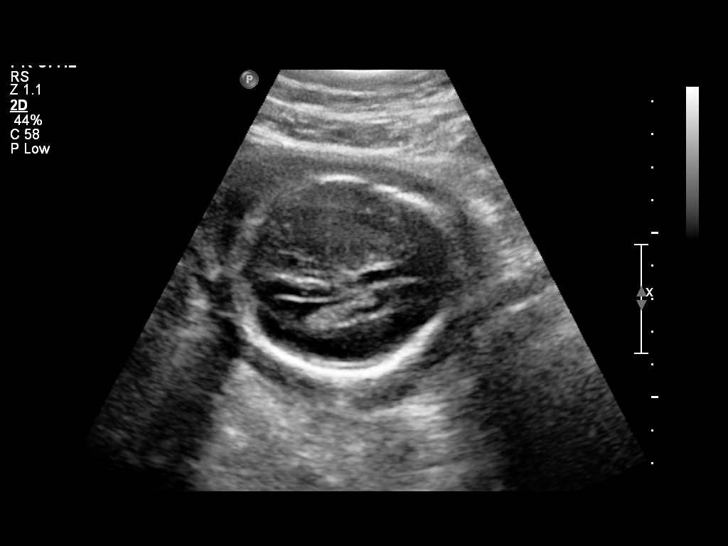

[12 of 28 positions shown; findings below may reference images not displayed]

OBSTETRICS REPORT
                      (Signed Final 09/16/2012 [DATE])

                                                         CNM
Service(s) Provided

 US OB FOLLOW UP                                       76816.1
Indications

 Poor obstetric history-Recurrent (habitual) abortion
 (3 consecutive ab's)
 Obesity
 Follow-up incomplete fetal anatomic evaluation
Fetal Evaluation

 Num Of Fetuses:    1
 Fetal Heart Rate:  144                         bpm
 Cardiac Activity:  Observed
 Presentation:      Cephalic
 Placenta:          Posterior, above cervical
                    os
 P. Cord            Previously Visualized
 Insertion:

 Amniotic Fluid
 AFI FV:      Subjectively within normal limits
                                             Larg Pckt:     4.9  cm
Biometry

 BPD:     56.3  mm    G. Age:   23w 1d                CI:        73.59   70 - 86
                                                      FL/HC:      20.5   18.7 -

 HC:     208.5  mm    G. Age:   23w 0d        5  %    HC/AC:      1.03   1.05 -

 AC:     201.6  mm    G. Age:   24w 5d       61  %    FL/BPD:     76.0   71 - 87
 FL:      42.8  mm    G. Age:   24w 0d       33  %    FL/AC:      21.2   20 - 24
 HUM:     41.9  mm    G. Age:   25w 2d       68  %

 Est. FW:     674  gm      1 lb 8 oz     54  %
Gestational Age

 Clinical EDD:  24w 1d                                        EDD:   01/05/13
 U/S Today:     23w 5d                                        EDD:   01/08/13
 Best:          24w 1d    Det. By:   Clinical EDD             EDD:   01/05/13
Anatomy

 Cranium:          Appears normal         Ductal Arch:      Previously seen
 Fetal Cavum:      Previously seen        Diaphragm:        Appears normal
 Ventricles:       Appears normal         Stomach:          Appears normal, left
                                                            sided
 Choroid Plexus:   Previously seen        Abdomen:          Appears normal
 Cerebellum:       Previously seen        Abdominal Wall:   Appears nml (cord
                                                            insert, abd wall)
 Posterior Fossa:  Previously seen        Cord Vessels:     Previously seen
 Nuchal Fold:      Previously seen        Kidneys:          Appear normal
 Face:             Orbits and profile     Bladder:          Appears normal
                   previously seen
 Lips:             Previously seen        Spine:            Appears normal
 Heart:            Appears normal (4      Lower             Previously seen
                   chamber & axis)        Extremities:
 RVOT:             Not well visualized    Upper             Previously seen
                                          Extremities:
 LVOT:             Previously seen        Limbs:            Four extremities
                                                            seen
 Aortic Arch:      Previously seen

 Other:  Fetus previously appeared to be a male. Heels previously visualized.
         5th digit previously visualized. Technically difficult due to maternal
         habitus and fetal position.
Cervix Uterus Adnexa

 Cervical Length:   4.1       cm

 Cervix:       Normal appearance by transabdominal scan.
 Left Ovary:   Not visualized.
 Right Ovary:  Not visualized.

 Adnexa:     No abnormality visualized.
Impression

 Single living IUP with assigned GA of 24w 1d. Appropriate
 fetal growth; EFW is at the 54th percentile.
 The fetal spine and diaphragm are well visualized today and
 appear normal.
 The RVOT is not well visualized on today's exam.
 The amniotic fluid volume and cervical length are normal.

 questions or concerns.

## 2014-10-26 ENCOUNTER — Encounter (HOSPITAL_COMMUNITY): Payer: Self-pay

## 2015-04-30 IMAGING — US US OB TRANSVAGINAL
1 series · 13 of 28 positions shown · non-contrast
Comparison: None.

***ADDENDUM*** CREATED: 06/10/2013 [DATE]

Additional ultrasound exams also included in this report:
Obstetrical ultrasound less than 14 weeks complete
Obstetrical ultrasound less than 14 weeks additional gestation
CLINICAL DATA: Vaginal bleeding.  Twin pregnancy with assigned
gestational age of 7 weeks 5 days by prior ultrasound.
TRANSVAGINAL OB US

[Series 1: us ob follow up · 38 acquisitions, 13 frames shown]
[im 2/38]
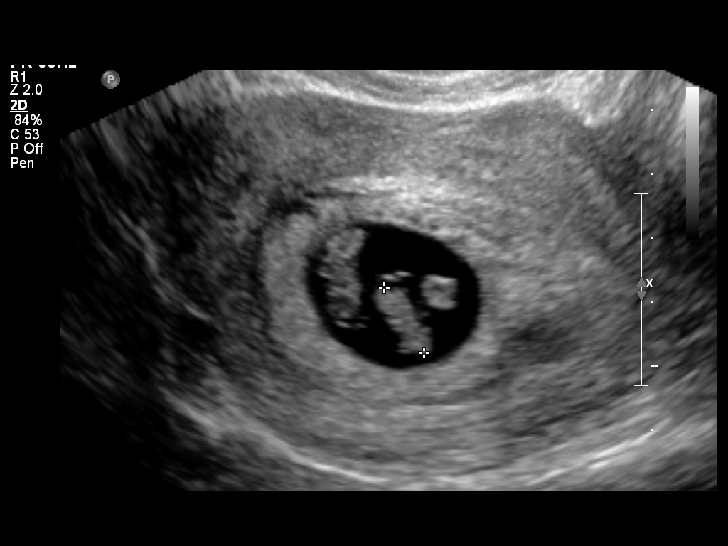
[im 5/38]
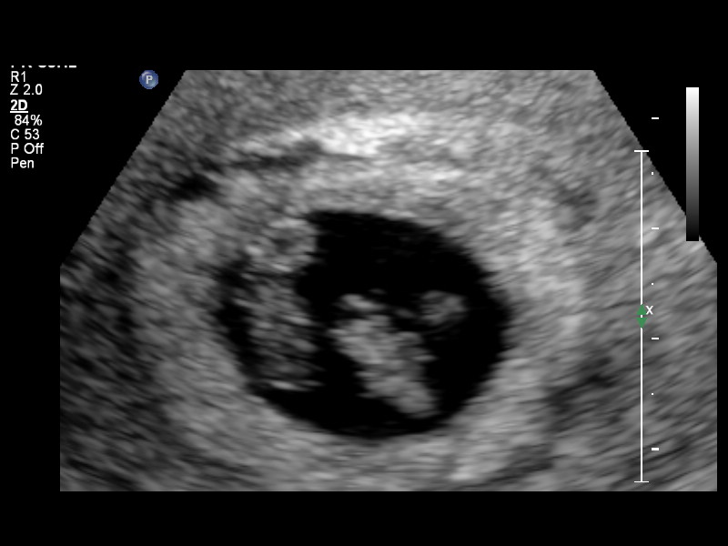
[im 7/38]
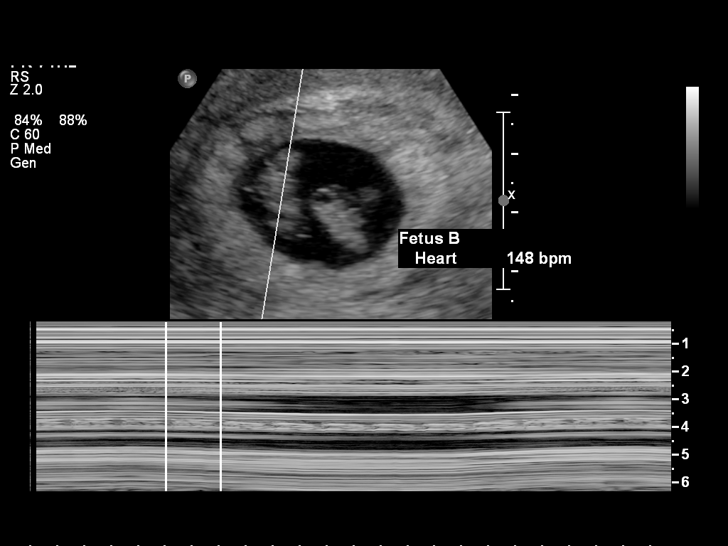
[im 10/38]
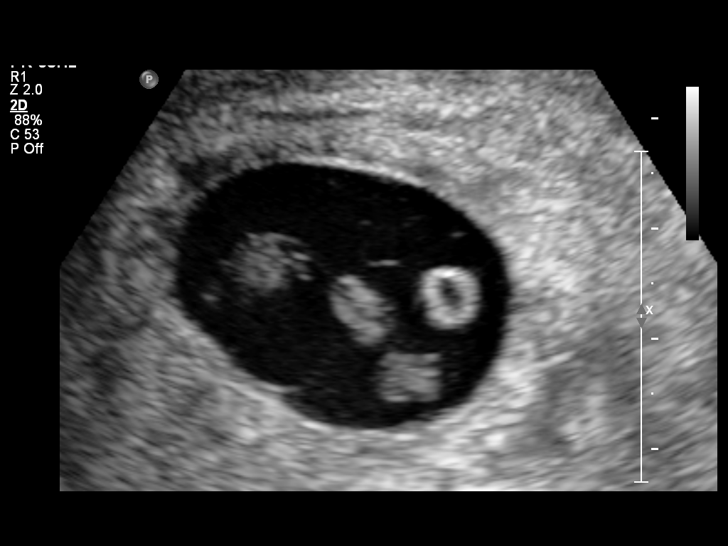
[im 13/38]
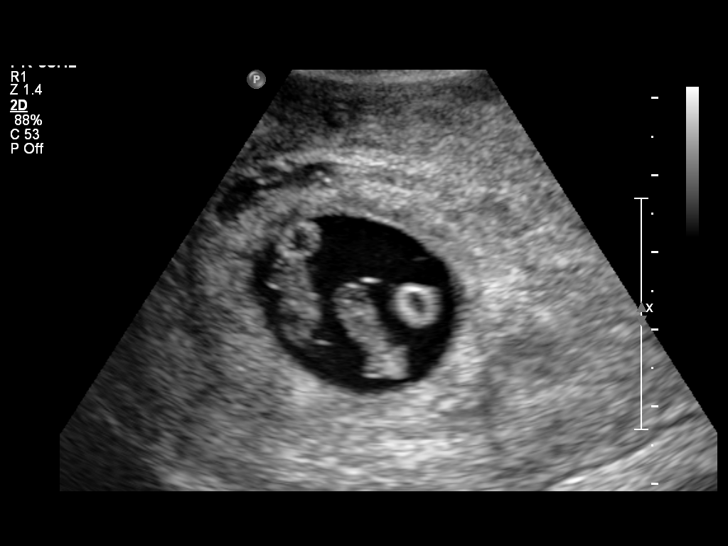
[im 16/38]
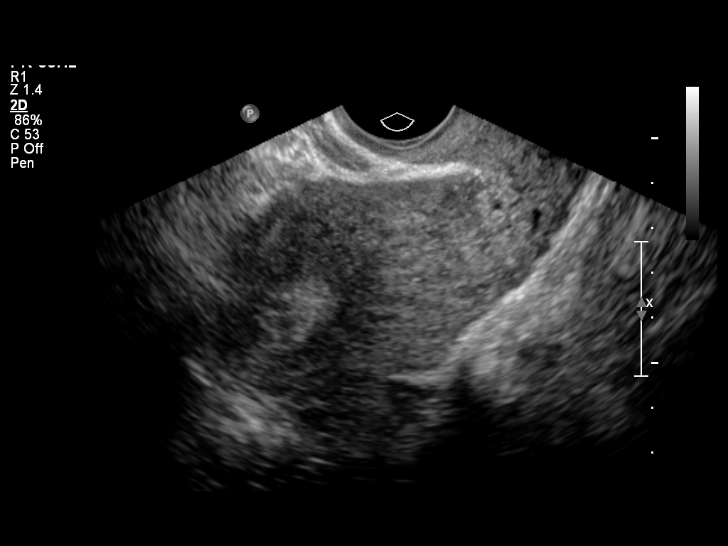
[im 20/38]
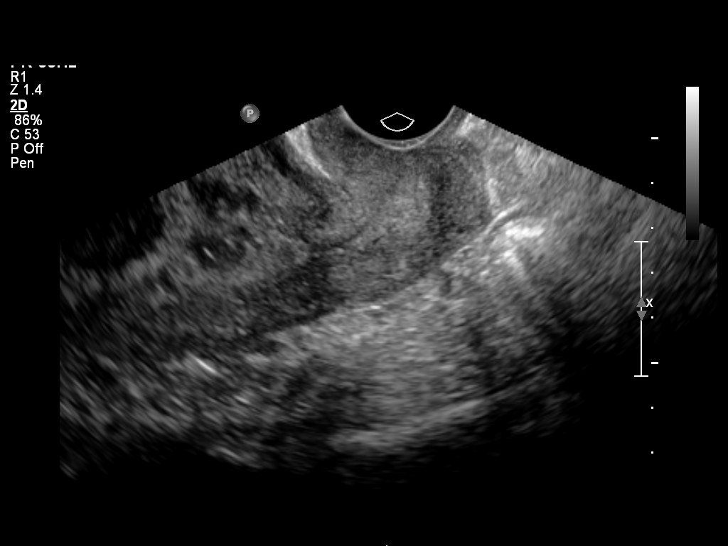
[im 22/38]
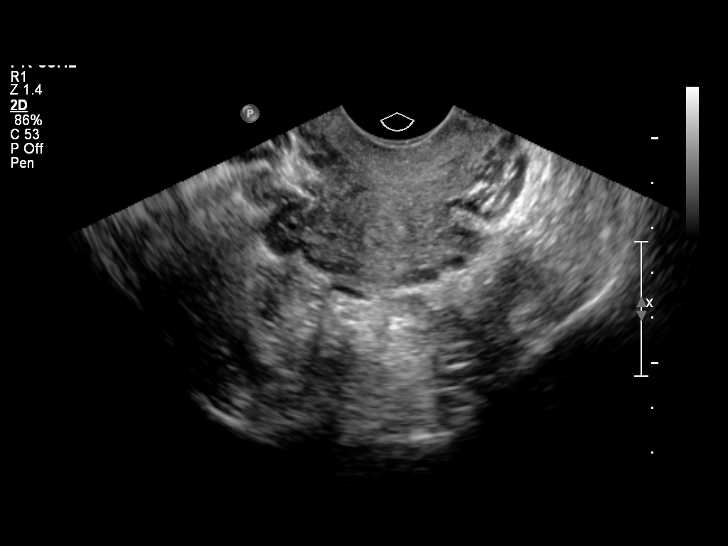
[im 25/38]
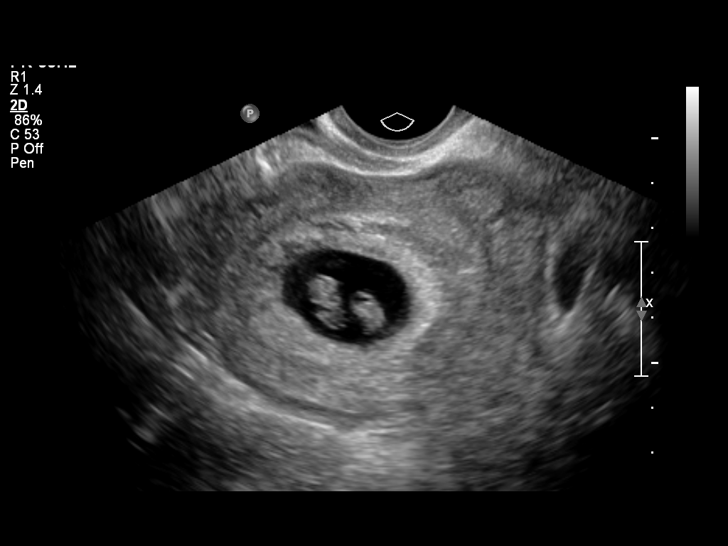
[im 28/38]
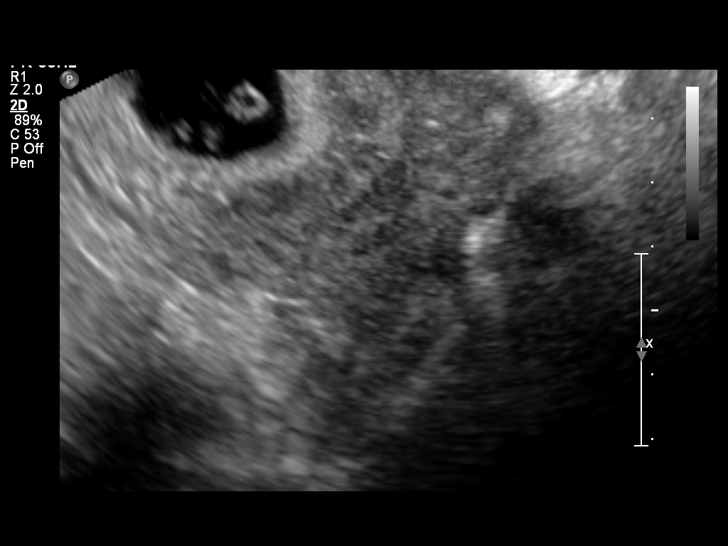
[im 31/38]
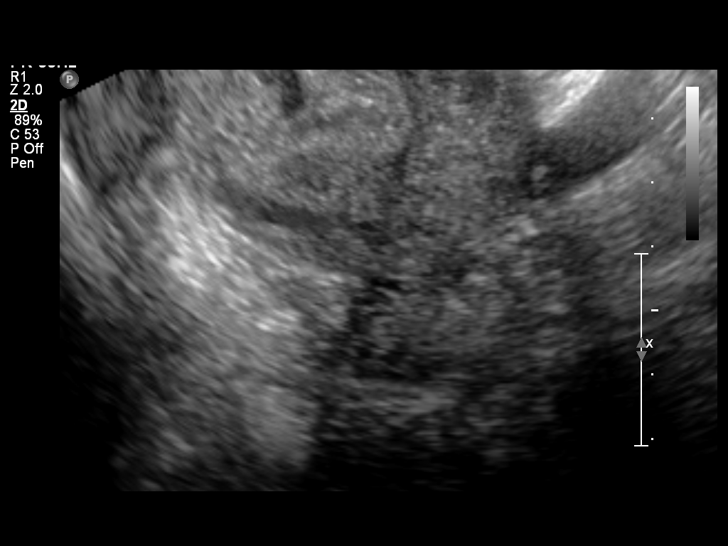
[im 33/38]
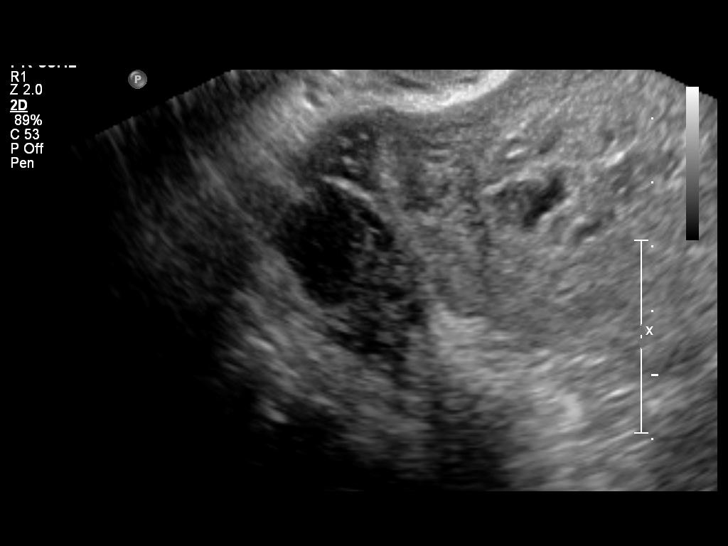
[im 36/38]
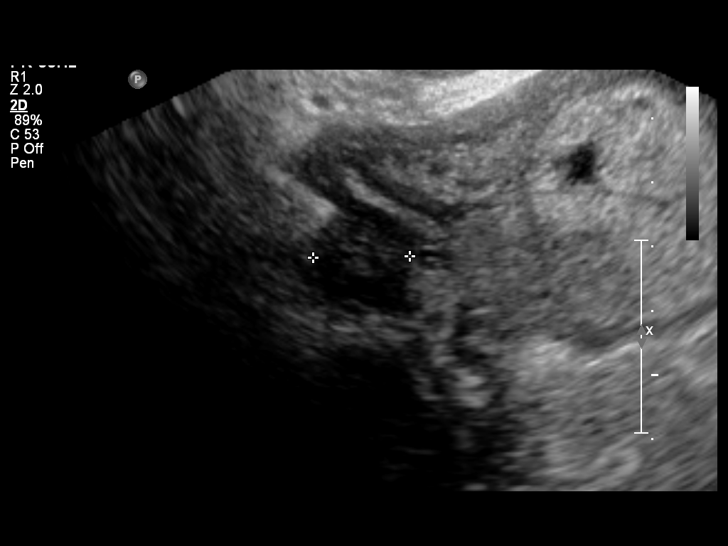

[13 of 28 positions shown; findings below may reference images not displayed]

A living monochorionic twin IUP is seen.  A thin separating
membrane is faintly visualized, consistent with a monochorionic
diamniotic twin gestation.

TWIN 1
Intrauterine gestational sac: Visualized/normal in shape.
Yolk sac: Visualized
Embryo: Visualized
Cardiac Activity: Visualized
Heart Rate:  089bpm

CRL: 12 mm  7w    4d          US EDC: 01/20/2014

TWIN 2
Intrauterine gestational sac: Visualized/normal in shape.
Yolk sac: Visualized
Embryo: Visualized
Cardiac Activity: Visualized
Heart rate:  245bpm

CRL:  13 mm in    7w    4d          US EDC: 01/20/2014

Maternal uterus/adnexae:
A small subchorionic hemorrhage noted.  No fibroids identified.
Both ovaries are normal in appearance.  No evidence of adnexal mass
or free fluid.
IMPRESSION: 1.  Living monochorionic, diamniotic twin IUP, with assigned
gestational age of 7 weeks 5 days by prior ultrasound.  Appropriate
and concordant twin growth.
2.  Small subchronic hemorrhage noted.

## 2016-02-07 ENCOUNTER — Encounter: Payer: Self-pay | Admitting: Obstetrics & Gynecology

## 2016-02-07 ENCOUNTER — Ambulatory Visit (INDEPENDENT_AMBULATORY_CARE_PROVIDER_SITE_OTHER): Payer: BLUE CROSS/BLUE SHIELD | Admitting: Obstetrics & Gynecology

## 2016-02-07 VITALS — BP 129/74 | HR 90 | Temp 98.3°F | Ht 65.0 in | Wt 270.0 lb

## 2016-02-07 DIAGNOSIS — Z3041 Encounter for surveillance of contraceptive pills: Secondary | ICD-10-CM

## 2016-02-07 DIAGNOSIS — Z3046 Encounter for surveillance of implantable subdermal contraceptive: Secondary | ICD-10-CM | POA: Diagnosis not present

## 2016-02-07 MED ORDER — DROSPIRENONE-ETHINYL ESTRADIOL 3-0.03 MG PO TABS: 1.0000 | ORAL_TABLET | Freq: Every day | ORAL | Status: AC

## 2016-02-07 NOTE — Progress Notes (Signed)
     GYNECOLOGY CLINIC PROCEDURE NOTE  Alison Mcdonald is a 35 y.o. [id number] here for Nexplanon removal.  This was placed after her delivery of twins in 01/2014. She reports gaining over 40 lbs and other side effects, desire removal and wants to start Yasmin as she had good response in past.    Last pap smear was on 2015 and was normal.  No other gynecologic concerns.  Nexplanon Removal Patient identified, informed consent performed, consent signed.   Appropriate time out taken. Nexplanon site identified.  Area prepped in usual sterile fashon. One ml of 1% lidocaine was used to anesthetize the area at the distal end of the implant. A small stab incision was made right beside the implant on the distal portion.  The Nexplanon rod was grasped using hemostats and removed without difficulty.  There was minimal blood loss. There were no complications.  3 ml of 1% lidocaine was injected around the incision for post-procedure analgesia.  Steri-strips were applied over the small incision.  A pressure bandage was applied to reduce any bruising.  The patient tolerated the procedure well and was given post procedure instructions.    Patient is planning to use Yasmin for contraception and this was prescribed.   She will follow up in 3 months with her PCP for BP check.   Verita Schneiders, MD, [place] Attending Obstetrician & Gynecologist, [place] for [facility]

## 2016-02-07 NOTE — Patient Instructions (Signed)
BP check in 3 months

## 2016-02-23 DIAGNOSIS — L989 Disorder of the skin and subcutaneous tissue, unspecified: Secondary | ICD-10-CM

## 2016-02-23 HISTORY — DX: Disorder of the skin and subcutaneous tissue, unspecified: L98.9

## 2016-03-06 ENCOUNTER — Encounter (HOSPITAL_BASED_OUTPATIENT_CLINIC_OR_DEPARTMENT_OTHER): Payer: Self-pay | Admitting: *Deleted

## 2016-03-06 NOTE — H&P (Signed)
  Subjective:    Patient ID: Alison Mcdonald is a 35 y.o. female.  HPI  Referred by Dr. Renda Rolls for treatment. Presents with nevus scalp present since birth and has continued to grow, reports initially flat now raised and bleeds frequently, with minor trauma. Reports itching. No prior biopsy. No family history skin cancer, melanoma.  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  Review of Systems  Skin:  + scalp mass  Neurological: Positive for headaches.     Objective:   Physical Exam  Constitutional: She is oriented to person, place, and time.  HENT:  Vertex scalp raised nodular mass 2.5 x 2 x 1 cm, no hair growth of mass, no active bleeding  Cardiovascular: Normal rate and regular rhythm.  Pulmonary/Chest: Effort normal and breath sounds normal.  Abdominal: Soft.  Neurological: She is alert and oriented to person, place, and time.  Skin:  Fitzpatrick 6    Assessment:     Skin lesion scalp    Plan:     Growing mass that bleeds, recommend excisional biopsy. Offered in office vs under sedation, elected for latter. Reviewed scar itself may not grow hair, can experience HA with procedure short term, will need help with child care and someone in house for day/night of procedure. Reviewed risks recurrence, additional procedure pending pathology, wound healing problems.    Irene Limbo, MD [facility] Plastic & Reconstructive Surgery [phone]

## 2016-03-13 ENCOUNTER — Encounter (HOSPITAL_BASED_OUTPATIENT_CLINIC_OR_DEPARTMENT_OTHER): Payer: Self-pay

## 2016-03-13 ENCOUNTER — Ambulatory Visit (HOSPITAL_BASED_OUTPATIENT_CLINIC_OR_DEPARTMENT_OTHER)
Admission: RE | Admit: 2016-03-13 | Discharge: 2016-03-13 | Disposition: A | Discharge status: Home / Self Care | Payer: BLUE CROSS/BLUE SHIELD | Source: Ambulatory Visit | Attending: Plastic Surgery | Admitting: Plastic Surgery

## 2016-03-13 ENCOUNTER — Ambulatory Visit (HOSPITAL_BASED_OUTPATIENT_CLINIC_OR_DEPARTMENT_OTHER): Payer: BLUE CROSS/BLUE SHIELD | Admitting: Anesthesiology

## 2016-03-13 ENCOUNTER — Encounter (HOSPITAL_BASED_OUTPATIENT_CLINIC_OR_DEPARTMENT_OTHER)
Admission: RE | Disposition: A | Discharge status: Home / Self Care | Payer: Self-pay | Source: Ambulatory Visit | Attending: Plastic Surgery

## 2016-03-13 DIAGNOSIS — D224 Melanocytic nevi of scalp and neck: Secondary | ICD-10-CM | POA: Diagnosis not present

## 2016-03-13 DIAGNOSIS — L989 Disorder of the skin and subcutaneous tissue, unspecified: Secondary | ICD-10-CM | POA: Diagnosis present

## 2016-03-13 DIAGNOSIS — F17200 Nicotine dependence, unspecified, uncomplicated: Secondary | ICD-10-CM | POA: Insufficient documentation

## 2016-03-13 DIAGNOSIS — Z6841 Body Mass Index (BMI) 40.0 and over, adult: Secondary | ICD-10-CM | POA: Insufficient documentation

## 2016-03-13 HISTORY — DX: Disorder of the skin and subcutaneous tissue, unspecified: L98.9

## 2016-03-13 HISTORY — PX: LESION REMOVAL: SHX5196

## 2016-03-13 SURGERY — WIDE EXCISION, LESION, UPPER EXTREMITY
Anesthesia: Monitor Anesthesia Care | Site: Head

## 2016-03-13 MED ORDER — CEFAZOLIN SODIUM-DEXTROSE 2-3 GM-% IV SOLR
INTRAVENOUS | Status: AC
  Filled 2016-03-13: qty 50

## 2016-03-13 MED ORDER — BUPIVACAINE-EPINEPHRINE 0.25% -1:200000 IJ SOLN
INTRAMUSCULAR | Status: DC | PRN
  Administered 2016-03-13: 8.5 mL

## 2016-03-13 MED ORDER — MEPERIDINE HCL 25 MG/ML IJ SOLN: 6.2500 mg | INTRAMUSCULAR | Status: DC | PRN

## 2016-03-13 MED ORDER — LIDOCAINE HCL (CARDIAC) 20 MG/ML IV SOLN
INTRAVENOUS | Status: AC
  Filled 2016-03-13: qty 5

## 2016-03-13 MED ORDER — CEFAZOLIN SODIUM-DEXTROSE 2-3 GM-% IV SOLR
2.0000 g | INTRAVENOUS | Status: AC
  Administered 2016-03-13: 3 g via INTRAVENOUS

## 2016-03-13 MED ORDER — FENTANYL CITRATE (PF) 100 MCG/2ML IJ SOLN
INTRAMUSCULAR | Status: AC
  Filled 2016-03-13: qty 2

## 2016-03-13 MED ORDER — OXYCODONE HCL 5 MG/5ML PO SOLN: 5.0000 mg | Freq: Once | ORAL | Status: DC | PRN

## 2016-03-13 MED ORDER — PROPOFOL 10 MG/ML IV BOLUS
INTRAVENOUS | Status: AC
  Filled 2016-03-13: qty 20

## 2016-03-13 MED ORDER — OXYCODONE HCL 5 MG PO TABS: 5.0000 mg | ORAL_TABLET | Freq: Once | ORAL | Status: DC | PRN

## 2016-03-13 MED ORDER — BUPIVACAINE-EPINEPHRINE (PF) 0.25% -1:200000 IJ SOLN
INTRAMUSCULAR | Status: AC
  Filled 2016-03-13: qty 30

## 2016-03-13 MED ORDER — HYDROMORPHONE HCL 1 MG/ML IJ SOLN: 0.2500 mg | INTRAMUSCULAR | Status: DC | PRN

## 2016-03-13 MED ORDER — LIDOCAINE-EPINEPHRINE 1 %-1:100000 IJ SOLN
INTRAMUSCULAR | Status: AC
  Filled 2016-03-13: qty 1

## 2016-03-13 MED ORDER — PROPOFOL 500 MG/50ML IV EMUL
INTRAVENOUS | Status: DC | PRN
  Administered 2016-03-13: 100 ug/kg/min via INTRAVENOUS

## 2016-03-13 MED ORDER — BACITRACIN 500 UNIT/GM EX OINT
TOPICAL_OINTMENT | CUTANEOUS | Status: DC | PRN
  Administered 2016-03-13: 1 via TOPICAL

## 2016-03-13 MED ORDER — CEFAZOLIN SODIUM 1-5 GM-% IV SOLN
INTRAVENOUS | Status: AC
  Filled 2016-03-13: qty 50

## 2016-03-13 MED ORDER — SCOPOLAMINE 1 MG/3DAYS TD PT72: 1.0000 | MEDICATED_PATCH | Freq: Once | TRANSDERMAL | Status: DC | PRN

## 2016-03-13 MED ORDER — MIDAZOLAM HCL 2 MG/2ML IJ SOLN
INTRAMUSCULAR | Status: AC
  Filled 2016-03-13: qty 2

## 2016-03-13 MED ORDER — GLYCOPYRROLATE 0.2 MG/ML IJ SOLN: 0.2000 mg | Freq: Once | INTRAMUSCULAR | Status: DC | PRN

## 2016-03-13 MED ORDER — LACTATED RINGERS IV SOLN
INTRAVENOUS | Status: DC
  Administered 2016-03-13: 12:00:00 via INTRAVENOUS

## 2016-03-13 MED ORDER — FENTANYL CITRATE (PF) 100 MCG/2ML IJ SOLN
50.0000 ug | INTRAMUSCULAR | Status: AC | PRN
  Administered 2016-03-13 (×4): 50 ug via INTRAVENOUS

## 2016-03-13 MED ORDER — MIDAZOLAM HCL 2 MG/2ML IJ SOLN
1.0000 mg | INTRAMUSCULAR | Status: DC | PRN
  Administered 2016-03-13: 2 mg via INTRAVENOUS

## 2016-03-13 MED ORDER — LIDOCAINE HCL (CARDIAC) 20 MG/ML IV SOLN
INTRAVENOUS | Status: DC | PRN
  Administered 2016-03-13: 50 mg via INTRAVENOUS

## 2016-03-13 MED ORDER — HYDROCODONE-ACETAMINOPHEN 5-325 MG PO TABS: 1.0000 | ORAL_TABLET | Freq: Four times a day (QID) | ORAL | Status: AC | PRN

## 2016-03-13 MED ORDER — BACITRACIN-NEOMYCIN-POLYMYXIN 400-5-5000 EX OINT
TOPICAL_OINTMENT | CUTANEOUS | Status: AC
  Filled 2016-03-13: qty 1

## 2016-03-13 SURGICAL SUPPLY — 41 items
BLADE CLIPPER SURG (BLADE) IMPLANT
BLADE SURG 15 STRL LF DISP TIS (BLADE) ×1 IMPLANT
BLADE SURG 15 STRL SS (BLADE) ×1
COTTONBALL LRG STERILE PKG (GAUZE/BANDAGES/DRESSINGS) IMPLANT
COVER BACK TABLE 60X90IN (DRAPES) ×2 IMPLANT
COVER MAYO STAND STRL (DRAPES) ×2 IMPLANT
DECANTER SPIKE VIAL GLASS SM (MISCELLANEOUS) IMPLANT
DRAPE U-SHAPE 76X120 STRL (DRAPES) ×2 IMPLANT
ELECT COATED BLADE 2.86 ST (ELECTRODE) IMPLANT
ELECT NEEDLE BLADE 2-5/6 (NEEDLE) ×2 IMPLANT
ELECT REM PT RETURN 9FT ADLT (ELECTROSURGICAL) ×2
ELECTRODE REM PT RTRN 9FT ADLT (ELECTROSURGICAL) ×1 IMPLANT
GAUZE XEROFORM 1X8 LF (GAUZE/BANDAGES/DRESSINGS) IMPLANT
GAUZE XEROFORM 5X9 LF (GAUZE/BANDAGES/DRESSINGS) IMPLANT
GLOVE BIO SURGEON STRL SZ 6 (GLOVE) IMPLANT
GLOVE BIO SURGEON STRL SZ 6.5 (GLOVE) IMPLANT
GLOVE EXAM NITRILE EXT CUFF MD (GLOVE) ×2 IMPLANT
GLOVE SURG SS PI 6.0 STRL IVOR (GLOVE) ×2 IMPLANT
GLOVE SURG SS PI 7.0 STRL IVOR (GLOVE) ×2 IMPLANT
GOWN STRL REUS W/ TWL LRG LVL3 (GOWN DISPOSABLE) ×2 IMPLANT
GOWN STRL REUS W/TWL LRG LVL3 (GOWN DISPOSABLE) ×2
LIQUID BAND (GAUZE/BANDAGES/DRESSINGS) IMPLANT
NEEDLE PRECISIONGLIDE 27X1.5 (NEEDLE) ×2 IMPLANT
PACK BASIN DAY SURGERY FS (CUSTOM PROCEDURE TRAY) ×2 IMPLANT
PENCIL BUTTON HOLSTER BLD 10FT (ELECTRODE) ×2 IMPLANT
SHEET MEDIUM DRAPE 40X70 STRL (DRAPES) IMPLANT
SLEEVE SCD COMPRESS KNEE MED (MISCELLANEOUS) IMPLANT
SPONGE GAUZE 2X2 8PLY STRL LF (GAUZE/BANDAGES/DRESSINGS) IMPLANT
STRIP CLOSURE SKIN 1/2X4 (GAUZE/BANDAGES/DRESSINGS) IMPLANT
STRIP CLOSURE SKIN 1/4X4 (GAUZE/BANDAGES/DRESSINGS) IMPLANT
SUT CHROMIC 4 0 PS 2 18 (SUTURE) ×2 IMPLANT
SUT MNCRL AB 4-0 PS2 18 (SUTURE) ×2 IMPLANT
SUT PLAIN 5 0 P 3 18 (SUTURE) IMPLANT
SUT PROLENE 5 0 P 3 (SUTURE) IMPLANT
SUT PROLENE 5 0 PS 2 (SUTURE) IMPLANT
SUT PROLENE 6 0 P 1 18 (SUTURE) IMPLANT
SUT VICRYL 4-0 PS2 18IN ABS (SUTURE) ×2 IMPLANT
SYR BULB 3OZ (MISCELLANEOUS) IMPLANT
SYR CONTROL 10ML LL (SYRINGE) ×2 IMPLANT
TOWEL OR 17X24 6PK STRL BLUE (TOWEL DISPOSABLE) ×2 IMPLANT
TRAY DSU PREP LF (CUSTOM PROCEDURE TRAY) ×2 IMPLANT

## 2016-03-13 NOTE — Anesthesia Procedure Notes (Signed)
Procedure Name: MAC Date/Time: 03/13/2016 12:10 PM Performed by: Lieutenant Diego Pre-anesthesia Checklist: Patient identified, Timeout performed, Emergency Drugs available, Suction available and Patient being monitored Patient Re-evaluated:Patient Re-evaluated prior to inductionOxygen Delivery Method: Simple face mask Intubation Type: IV induction

## 2016-03-13 NOTE — Anesthesia Preprocedure Evaluation (Signed)
Anesthesia Evaluation  Patient identified by MRN, date of birth, ID band Patient awake    Reviewed: Allergy & Precautions, NPO status , Patient's Chart, lab work & pertinent test results  Airway Mallampati: I  TM Distance: >3 FB Neck ROM: Full    Dental  (+) Teeth Intact, Dental Advisory Given   Pulmonary Current Smoker,    breath sounds clear to auscultation       Cardiovascular  Rhythm:Regular Rate:Normal     Neuro/Psych    GI/Hepatic   Endo/Other  Morbid obesity  Renal/GU      Musculoskeletal   Abdominal   Peds  Hematology   Anesthesia Other Findings   Reproductive/Obstetrics                             Anesthesia Physical Anesthesia Plan  ASA: II  Anesthesia Plan: MAC   Post-op Pain Management:    Induction: Intravenous  Airway Management Planned: Simple Face Mask  Additional Equipment:   Intra-op Plan:   Post-operative Plan:   Informed Consent: I have reviewed the patients History and Physical, chart, labs and discussed the procedure including the risks, benefits and alternatives for the proposed anesthesia with the patient or authorized representative who has indicated his/her understanding and acceptance.     Plan Discussed with: CRNA, Anesthesiologist and Surgeon  Anesthesia Plan Comments:         Anesthesia Quick Evaluation

## 2016-03-13 NOTE — Transfer of Care (Signed)
Immediate Anesthesia Transfer of Care Note  Patient: Alison Mcdonald  Procedure(s) Performed: Procedure(s): EXCISION BENIGN LESION SCALP 3.5CM AND LAYERED CLOSURE 5CM  (N/A)  Patient Location: PACU  Anesthesia Type:MAC  Level of Consciousness: awake  Airway & Oxygen Therapy: Patient Spontanous Breathing and Patient connected to face mask oxygen  Post-op Assessment: Report given to RN and Post -op Vital signs reviewed and stable  Post vital signs: Reviewed and stable  Last Vitals:  Filed Vitals:   03/13/16 1135  BP: 130/78  Pulse: 68  Temp: 36.8 C  Resp: 18    Complications: No apparent anesthesia complications

## 2016-03-13 NOTE — Op Note (Signed)
Operative Note   DATE OF OPERATION: 3.20.17  LOCATION: [facility]-outpatient  SURGICAL DIVISION: Plastic Surgery  PREOPERATIVE DIAGNOSES:  Scalp lesions  POSTOPERATIVE DIAGNOSES:  same  PROCEDURE:  1. Excision benign scalp lesion 3 x 2 cm 2. Layered closure scalp 4.5 cm  SURGEON: Irene Limbo MD MBA  ASSISTANT: none  ANESTHESIA:  MAC.   EBL: minimal  COMPLICATIONS: None immediate.   INDICATIONS FOR PROCEDURE:  The patient, Alison Mcdonald, is a 35 y.o. female born on 1981-03-26, is here for excision scalp lesion present for several years with continued growth and bleeding.    FINDINGS: Verrucous type growth near vertex, large central lesion with smaller adjacent lesions resected en bloc  DESCRIPTION OF PROCEDURE:  The patient's operative site was marked with the patient in the preoperative area. The patient was taken to the operating room. IV antibiotics were given. The patient's operative site was prepped and draped in a sterile fashion. A time out was performed and all information was confirmed to be correct. Local anesthetic infiltrated surrounding mass. Sharp excision completed of central lesion and adjacent growths full thickness through galea, with margins diameter 3 x 2 cm. Additional superior margin excised separately. Subgaleal dissection completed to aid with closure and layered closure completed with 4-0 monocryl and 4-0 vicryl in galea and dermis. Skin closure completed with short running and additional interrupted 4-0 chromic, length 4.5 cm. Antibiotic ointment applied.  The patient was allowed to wake from anesthesia, extubated and taken to the recovery room in satisfactory condition.   SPECIMENS: scalp lesions  DRAINS: none  Irene Limbo, MD [facility] Plastic & Reconstructive Surgery [phone]

## 2016-03-13 NOTE — Anesthesia Postprocedure Evaluation (Signed)
Anesthesia Post Note  Patient: Alison Mcdonald  Procedure(s) Performed: Procedure(s) (LRB): EXCISION BENIGN LESION SCALP 3.5CM AND LAYERED CLOSURE 5CM  (N/A)  Patient location during evaluation: PACU Anesthesia Type: General Level of consciousness: awake and alert Pain management: pain level controlled Vital Signs Assessment: post-procedure vital signs reviewed and stable Respiratory status: spontaneous breathing, nonlabored ventilation and respiratory function stable Cardiovascular status: blood pressure returned to baseline and stable Postop Assessment: no signs of nausea or vomiting Anesthetic complications: no    Last Vitals:  Filed Vitals:   03/13/16 1305 03/13/16 1314  BP:  127/76  Pulse: 58 59  Temp:  36.8 C  Resp: 14 16    Last Pain:  Filed Vitals:   03/13/16 1315  PainSc: 0-No pain                 Kirsti Mcalpine A

## 2016-03-13 NOTE — Interval H&P Note (Signed)
History and Physical Interval Note:  03/13/2016 7:06 AM  Alison Mcdonald  has presented today for surgery, with the diagnosis of CHANGING SKIN LESION SCALP   The various methods of treatment have been discussed with the patient and family. After consideration of risks, benefits and other options for treatment, the patient has consented to  Procedure(s): EXCISION BENIGN LESION SCALP 3.5CM AND LAYERED CLOSURE 5CM  (N/A) as a surgical intervention .  The patient's history has been reviewed, patient examined, no change in status, stable for surgery.  I have reviewed the patient's chart and labs.  Questions were answered to the patient's satisfaction.     Mallory Schaad

## 2016-03-13 NOTE — Discharge Instructions (Signed)

## 2016-03-14 ENCOUNTER — Encounter (HOSPITAL_BASED_OUTPATIENT_CLINIC_OR_DEPARTMENT_OTHER): Payer: Self-pay | Admitting: Plastic Surgery

## 2017-10-25 DEATH — deceased
